# Patient Record
Sex: Female | Born: 1975
Health system: Southern US, Community
[De-identification: ages and names within clinical notes are randomized; demographics above are authoritative.]

## PROBLEM LIST (undated history)

## (undated) DIAGNOSIS — K649 Unspecified hemorrhoids: Secondary | ICD-10-CM

## (undated) DIAGNOSIS — R112 Nausea with vomiting, unspecified: Secondary | ICD-10-CM

## (undated) DIAGNOSIS — Z9889 Other specified postprocedural states: Secondary | ICD-10-CM

## (undated) HISTORY — PX: GANGLION CYST EXCISION: SHX1691

## (undated) HISTORY — PX: ROTATOR CUFF REPAIR: SHX139

---

## 1997-05-17 ENCOUNTER — Other Ambulatory Visit: Admission: RE | Admit: 1997-05-17 | Discharge: 1997-05-17 | Payer: Self-pay | Admitting: Obstetrics and Gynecology

## 1998-03-28 ENCOUNTER — Other Ambulatory Visit: Admission: RE | Admit: 1998-03-28 | Discharge: 1998-03-28 | Payer: Self-pay | Admitting: Obstetrics and Gynecology

## 1998-04-19 ENCOUNTER — Other Ambulatory Visit: Admission: RE | Admit: 1998-04-19 | Discharge: 1998-04-19 | Payer: Self-pay | Admitting: Obstetrics and Gynecology

## 1998-07-15 ENCOUNTER — Inpatient Hospital Stay (HOSPITAL_COMMUNITY): Admission: AD | Admit: 1998-07-15 | Discharge: 1998-07-15 | Payer: Self-pay | Admitting: *Deleted

## 1999-01-11 ENCOUNTER — Inpatient Hospital Stay (HOSPITAL_COMMUNITY): Admission: AD | Admit: 1999-01-11 | Discharge: 1999-01-11 | Payer: Self-pay | Admitting: Obstetrics and Gynecology

## 1999-02-15 ENCOUNTER — Inpatient Hospital Stay (HOSPITAL_COMMUNITY): Admission: AD | Admit: 1999-02-15 | Discharge: 1999-02-15 | Payer: Self-pay | Admitting: Obstetrics and Gynecology

## 1999-02-21 ENCOUNTER — Inpatient Hospital Stay (HOSPITAL_COMMUNITY): Admission: AD | Admit: 1999-02-21 | Discharge: 1999-02-23 | Payer: Self-pay | Admitting: Obstetrics and Gynecology

## 1999-03-21 ENCOUNTER — Other Ambulatory Visit: Admission: RE | Admit: 1999-03-21 | Discharge: 1999-03-21 | Payer: Self-pay | Admitting: Obstetrics and Gynecology

## 2000-02-18 ENCOUNTER — Other Ambulatory Visit: Admission: RE | Admit: 2000-02-18 | Discharge: 2000-02-18 | Payer: Self-pay | Admitting: *Deleted

## 2002-03-30 ENCOUNTER — Other Ambulatory Visit: Admission: RE | Admit: 2002-03-30 | Discharge: 2002-03-30 | Payer: Self-pay | Admitting: Obstetrics and Gynecology

## 2003-04-05 ENCOUNTER — Other Ambulatory Visit: Admission: RE | Admit: 2003-04-05 | Discharge: 2003-04-05 | Payer: Self-pay | Admitting: Obstetrics and Gynecology

## 2003-12-11 ENCOUNTER — Ambulatory Visit: Payer: Self-pay | Admitting: Unknown Physician Specialty

## 2004-01-03 ENCOUNTER — Ambulatory Visit: Payer: Self-pay | Admitting: Unknown Physician Specialty

## 2004-01-18 ENCOUNTER — Encounter: Payer: Self-pay | Admitting: Unknown Physician Specialty

## 2004-01-21 ENCOUNTER — Encounter: Payer: Self-pay | Admitting: Unknown Physician Specialty

## 2004-01-21 DIAGNOSIS — D229 Melanocytic nevi, unspecified: Secondary | ICD-10-CM

## 2004-01-21 HISTORY — DX: Melanocytic nevi, unspecified: D22.9

## 2004-02-21 ENCOUNTER — Encounter: Payer: Self-pay | Admitting: Unknown Physician Specialty

## 2004-04-11 ENCOUNTER — Other Ambulatory Visit: Admission: RE | Admit: 2004-04-11 | Discharge: 2004-04-11 | Payer: Self-pay | Admitting: Obstetrics and Gynecology

## 2004-04-25 ENCOUNTER — Encounter: Payer: Self-pay | Admitting: General Practice

## 2004-05-20 ENCOUNTER — Encounter: Payer: Self-pay | Admitting: General Practice

## 2004-06-20 ENCOUNTER — Encounter: Payer: Self-pay | Admitting: General Practice

## 2005-01-08 ENCOUNTER — Other Ambulatory Visit: Admission: RE | Admit: 2005-01-08 | Discharge: 2005-01-08 | Payer: Self-pay | Admitting: Obstetrics and Gynecology

## 2005-07-25 ENCOUNTER — Inpatient Hospital Stay (HOSPITAL_COMMUNITY): Admission: AD | Admit: 2005-07-25 | Discharge: 2005-07-26 | Payer: Self-pay | Admitting: Obstetrics and Gynecology

## 2008-01-21 HISTORY — PX: OTHER SURGICAL HISTORY: SHX169

## 2009-12-26 ENCOUNTER — Emergency Department (HOSPITAL_COMMUNITY)
Admission: EM | Admit: 2009-12-26 | Discharge: 2009-12-26 | Payer: Self-pay | Source: Home / Self Care | Admitting: Emergency Medicine

## 2010-04-02 LAB — URINALYSIS, ROUTINE W REFLEX MICROSCOPIC
Bilirubin Urine: NEGATIVE
Glucose, UA: NEGATIVE mg/dL
Hgb urine dipstick: NEGATIVE
Ketones, ur: NEGATIVE mg/dL
Protein, ur: NEGATIVE mg/dL
Specific Gravity, Urine: 1.01 (ref 1.005–1.030)
pH: 5.5 (ref 5.0–8.0)

## 2010-04-02 LAB — CBC
HCT: 39 % (ref 36.0–46.0)
MCH: 30.5 pg (ref 26.0–34.0)
MCV: 85 fL (ref 78.0–100.0)
Platelets: 186 10*3/uL (ref 150–400)
WBC: 10.8 10*3/uL — ABNORMAL HIGH (ref 4.0–10.5)

## 2010-04-02 LAB — URINE MICROSCOPIC-ADD ON

## 2010-04-02 LAB — DIFFERENTIAL
Basophils Absolute: 0 10*3/uL (ref 0.0–0.1)
Basophils Relative: 0 % (ref 0–1)
Eosinophils Absolute: 0.1 10*3/uL (ref 0.0–0.7)
Monocytes Absolute: 0.6 10*3/uL (ref 0.1–1.0)
Monocytes Relative: 6 % (ref 3–12)

## 2010-04-02 LAB — BASIC METABOLIC PANEL
BUN: 14 mg/dL (ref 6–23)
CO2: 26 mEq/L (ref 19–32)
Chloride: 108 mEq/L (ref 96–112)
GFR calc Af Amer: >60 mL/min (ref >60)
GFR calc non Af Amer: >60 mL/min (ref >60)
Glucose, Bld: 99 mg/dL (ref 70–99)
Potassium: 3.8 mEq/L (ref 3.5–5.1)
Sodium: 138 mEq/L (ref 135–145)

## 2010-04-02 LAB — POCT CARDIAC MARKERS

## 2010-04-02 LAB — POCT PREGNANCY, URINE: Preg Test, Ur: NEGATIVE

## 2010-06-07 NOTE — Op Note (Signed)
[facility]  Patient:    Deborah Mills, Deborah Mills                       MRN: [id number] Attending:  Beather Arbour. Thomasena Edis, M.D.                           Operative Report  PREOPERATIVE DIAGNOSIS:       Vertex at +3 station, deep variables with pushing.  POSTOPERATIVE DIAGNOSIS:      Same.  PROCEDURE:                    Vacuum extraction delivery.  SURGEON:                      Artist Pais, M.D.  ESTIMATED BLOOD LOSS:         300 cc.  ANESTHESIA:                   Epidural/local with lidocaine.  DESCRIPTION OF PROCEDURE:     Patient had been pushing well.  Fetal heart tracing was noted to have repetitive deep variables with pushing.  The decision was made due to the deep variables to pursue vacuum extraction delivery.  Vertex was noted to be on the OA position.  Adequate pelvis with good epidural anesthesia, although the epidural had been turned off for quite a while.  The mushroom cup vacuum extractor was placed in appropriate position and with two contractions the vertex was delivered without difficulty over a second degree median episiotomy.  There is noted to be one pop-off.  There is a compound presentation with the infants left hand.  The shoulders were delivered without difficulty.  The cord was doubly clamped and cut after placing the infant on the maternal abdomen.  Placenta was  allowed to delivery spontaneous and intact with a three-vessel cord.  Cord pH and cord bloods were sent.  Cord pH was found to be 7.36.  There were noted to be no lacerations.  Repair of the episiotomy was performed using 3-0 Dexon in the usual fashion.  Approximately 7 cc of 1% lidocaine were infused for episiotomy repair. After repair there was noted to be an intact rectovaginal septum.  No sponges in the vagina.  Patient tolerated procedure well without complications.  No apparent complications and went to the normal postpartum floor.  The baby was found  to be a viable female, Apgars 9&9, cord pH 7.36.  Addendum:                     Vacuum extraction delivery was performed after obtaining informed consent. DD:  02/22/99 TD:  02/22/99 Job: [id number]

## 2011-05-26 ENCOUNTER — Ambulatory Visit: Payer: Self-pay | Admitting: Urology

## 2011-07-30 ENCOUNTER — Ambulatory Visit: Payer: Self-pay | Admitting: Internal Medicine

## 2011-10-18 ENCOUNTER — Ambulatory Visit: Payer: Self-pay | Admitting: Unknown Physician Specialty

## 2012-02-17 ENCOUNTER — Ambulatory Visit: Payer: Self-pay | Admitting: Urology

## 2013-09-25 IMAGING — CR DG LUMBAR SPINE 2-3V
1 series · 4 of 4 positions shown · non-contrast
Comparison: none

REASON FOR EXAM: pain
COMMENTS:

PROCEDURE:     KDR - KDXR LUMBAR SPINE AP AND LATERAL  - February 17, 2012  [DATE]
RESULT:     Lumbar spine shows grossly normal alignment without compression
deformity. No subluxation is seen. There is no bony destruction. MRI is
available for further assessment if desired.

[Series 1: ap · 0.17mm/px · 4 of 4 slices shown]
[im 1/4]
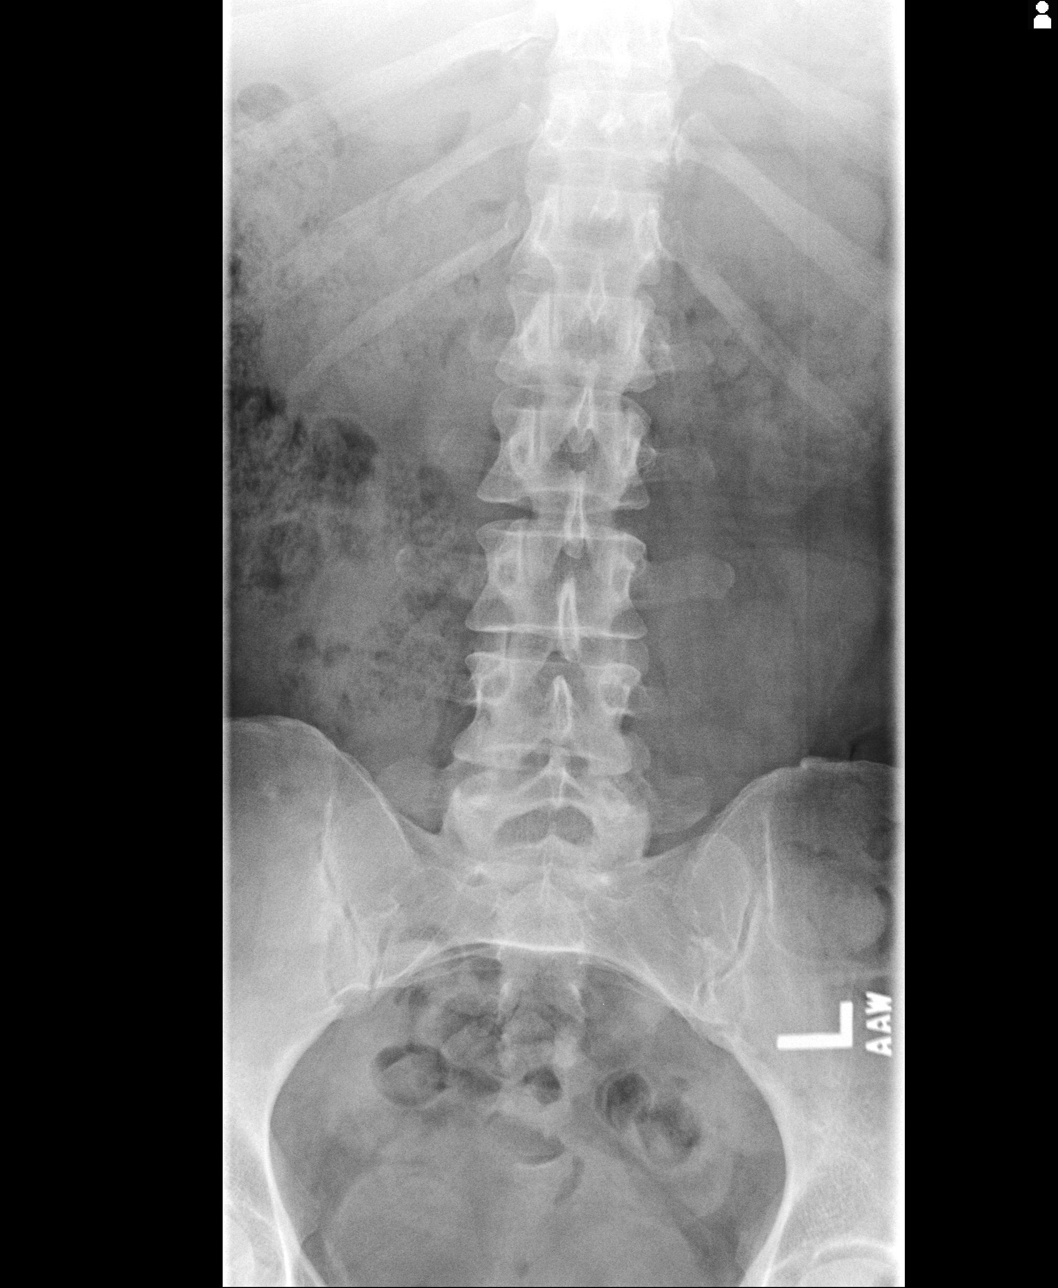
[im 2/4]
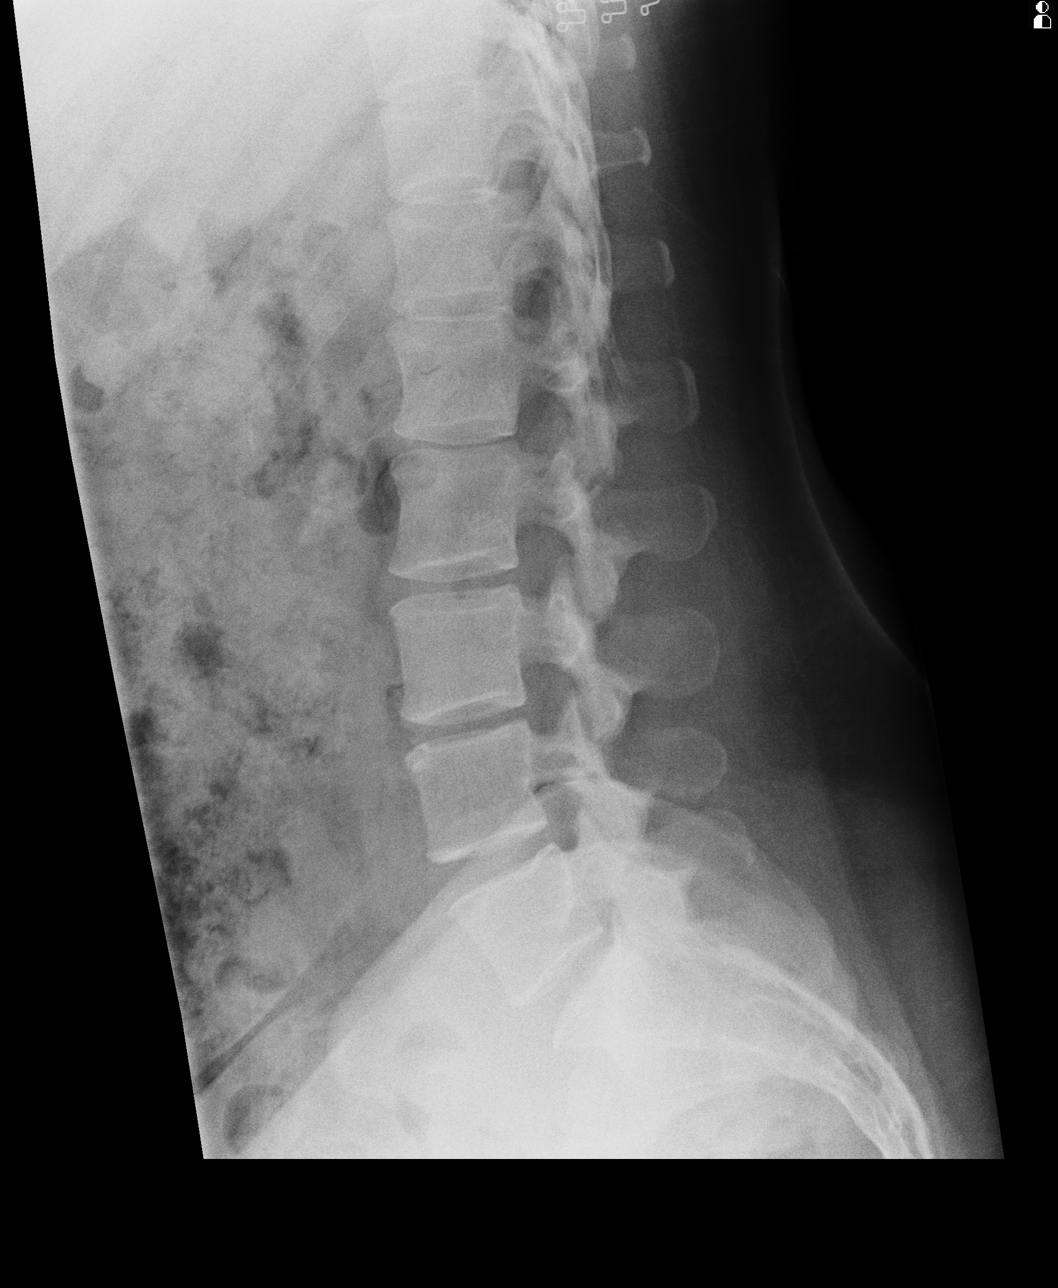
[im 3/4]
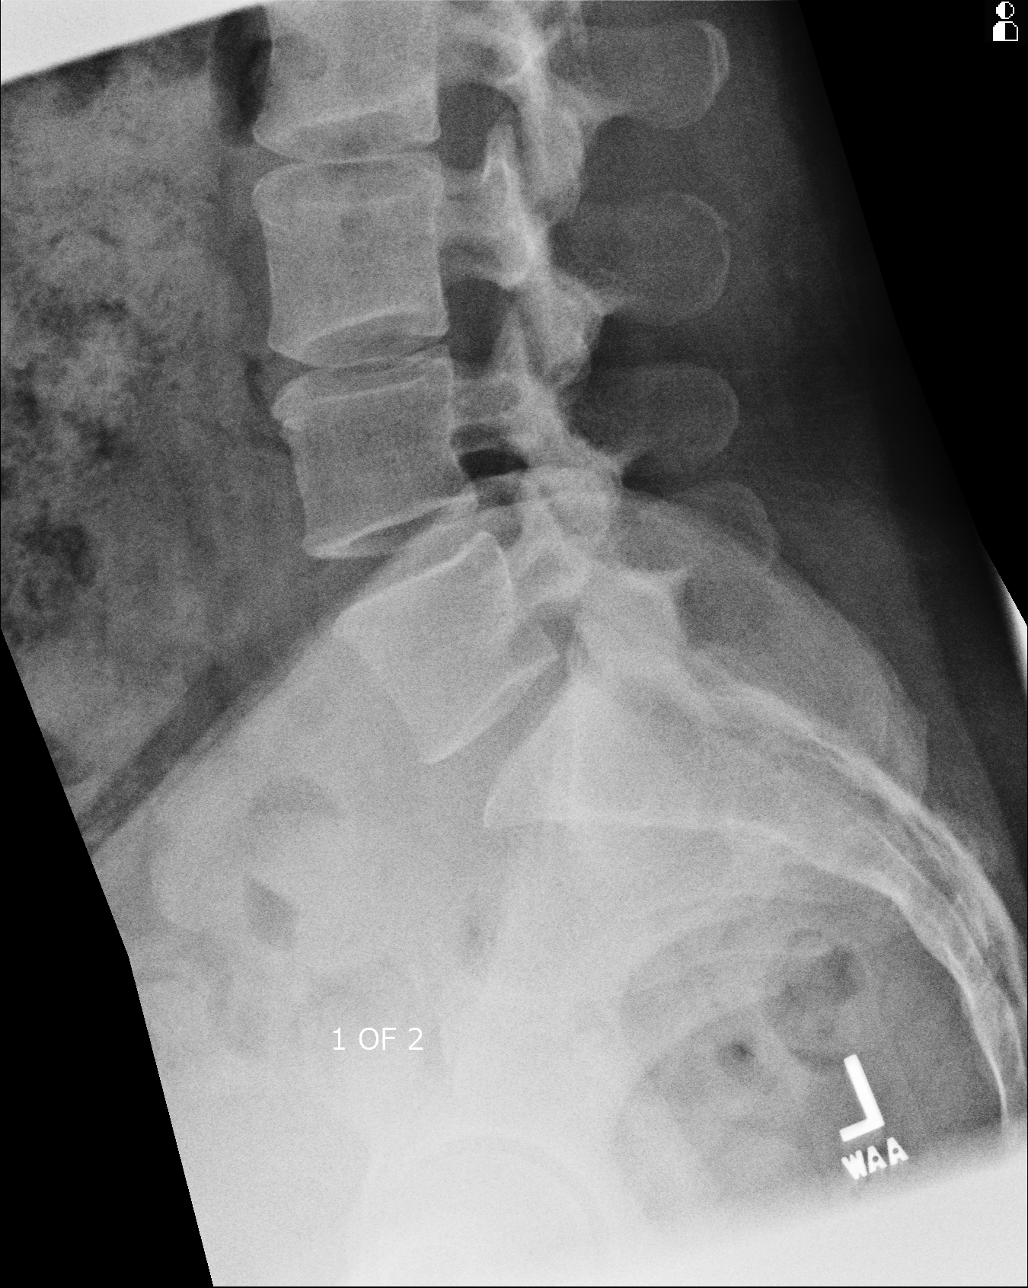
[im 4/4]
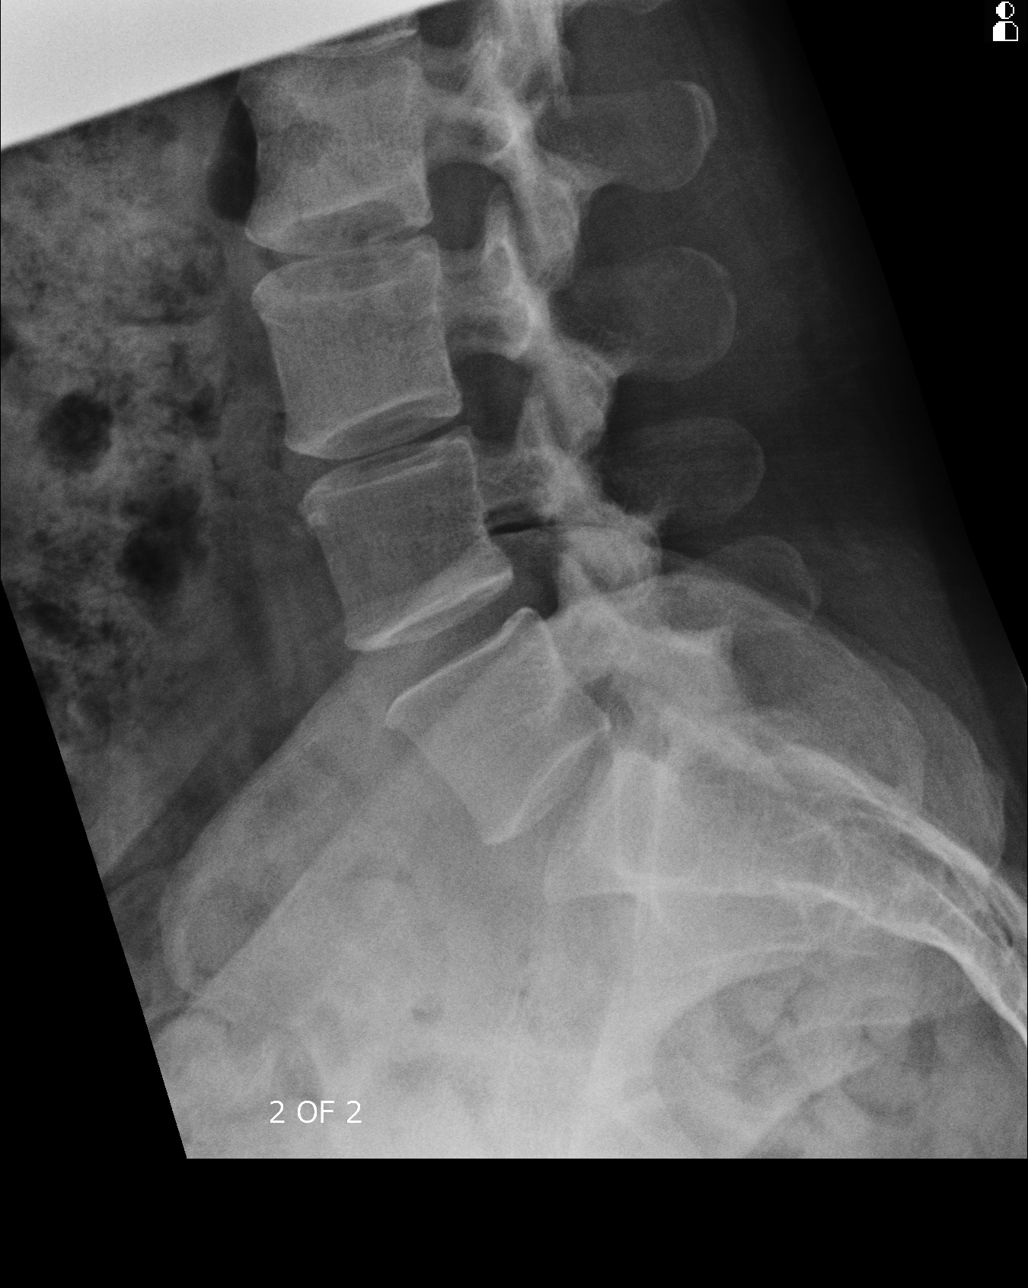

[4 of 4 positions shown; findings below may reference images not displayed]

IMPRESSION: Please see above.

[REDACTED]

## 2013-09-25 IMAGING — CR DG HIP COMPLETE 2+V*L*
1 series · 2 of 2 positions shown · non-contrast
Comparison: none

REASON FOR EXAM: low back pain, hip pain
COMMENTS:

PROCEDURE:     KDR - KDXR HIP LEFT COMPLETE  - February 17, 2012  [DATE]
RESULT:     AP and frog-leg lateral views of the left hip demonstrate no
fracture, dislocation or foreign body. No significant degenerative change is
appreciated. There is no focal bony lesion evident.

[Series 1: ap · 0.17mm/px · 2 of 2 slices shown]
[im 1/2]
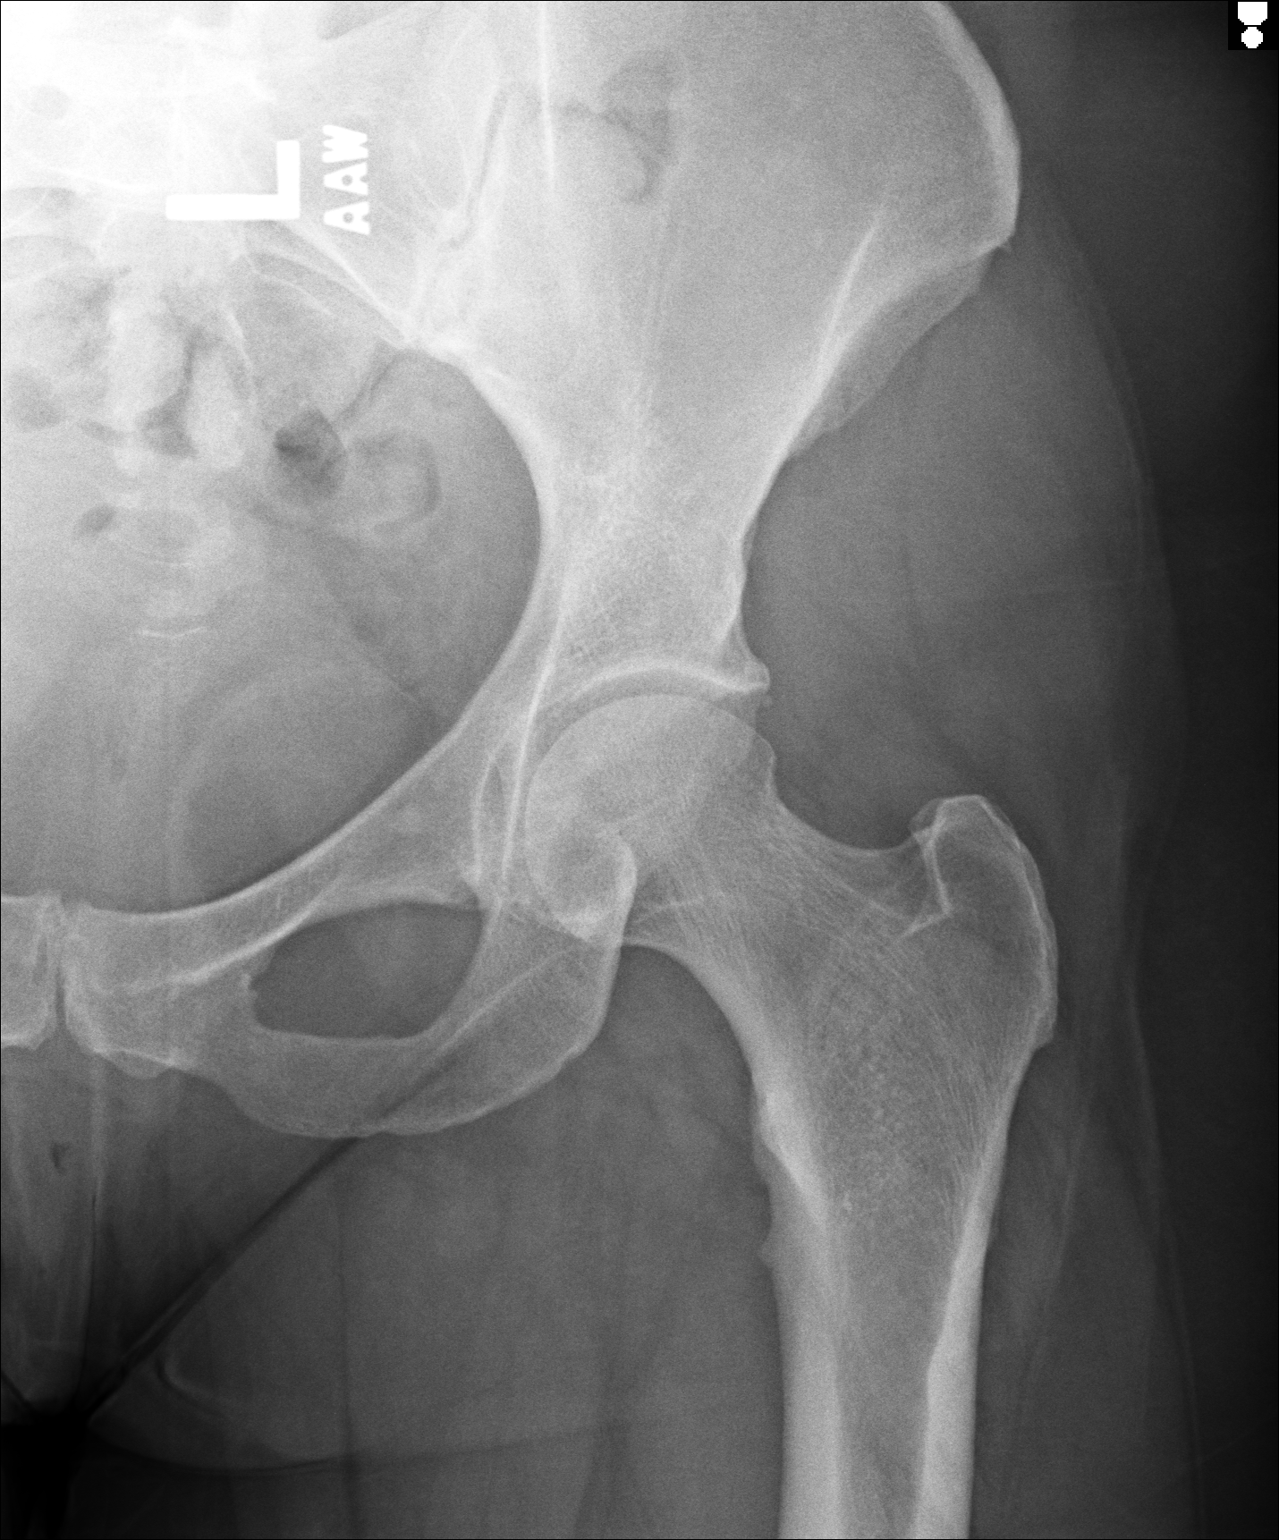
[im 2/2]
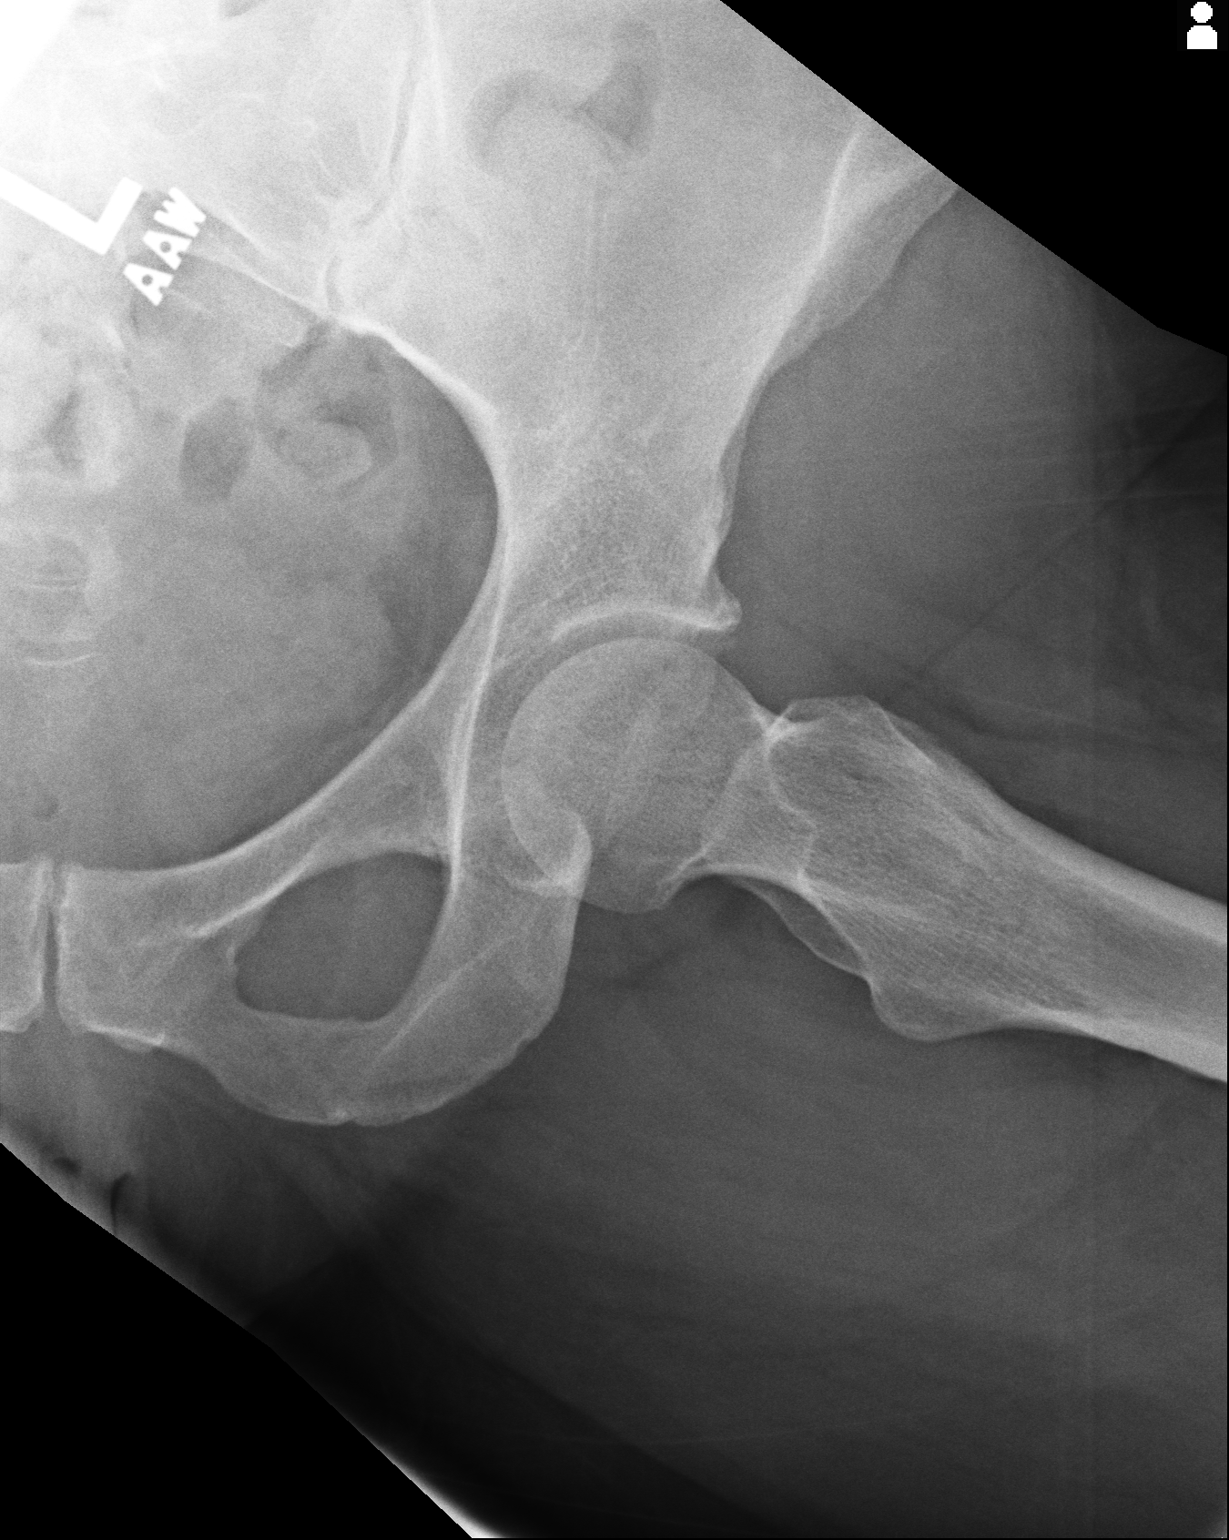

[2 of 2 positions shown; findings below may reference images not displayed]

IMPRESSION: Please see above.

[REDACTED]

## 2013-09-25 IMAGING — CR DG ABDOMEN 1V
1 series · 3 of 3 positions shown · non-contrast
Comparison: none

REASON FOR EXAM: low back pain, hip pain
COMMENTS:

PROCEDURE:     KDR - KDXR KIDNEY URETER BLADDER  - February 17, 2012  [DATE]
RESULT:     There is a moderate amount of fecal material scattered through
the colon. No definite nephrolithiasis is appreciated. No ureteral calculi
are evident.

[Series 1: supine kub · 0.17mm/px · 3 of 3 slices shown]
[im 1/3]
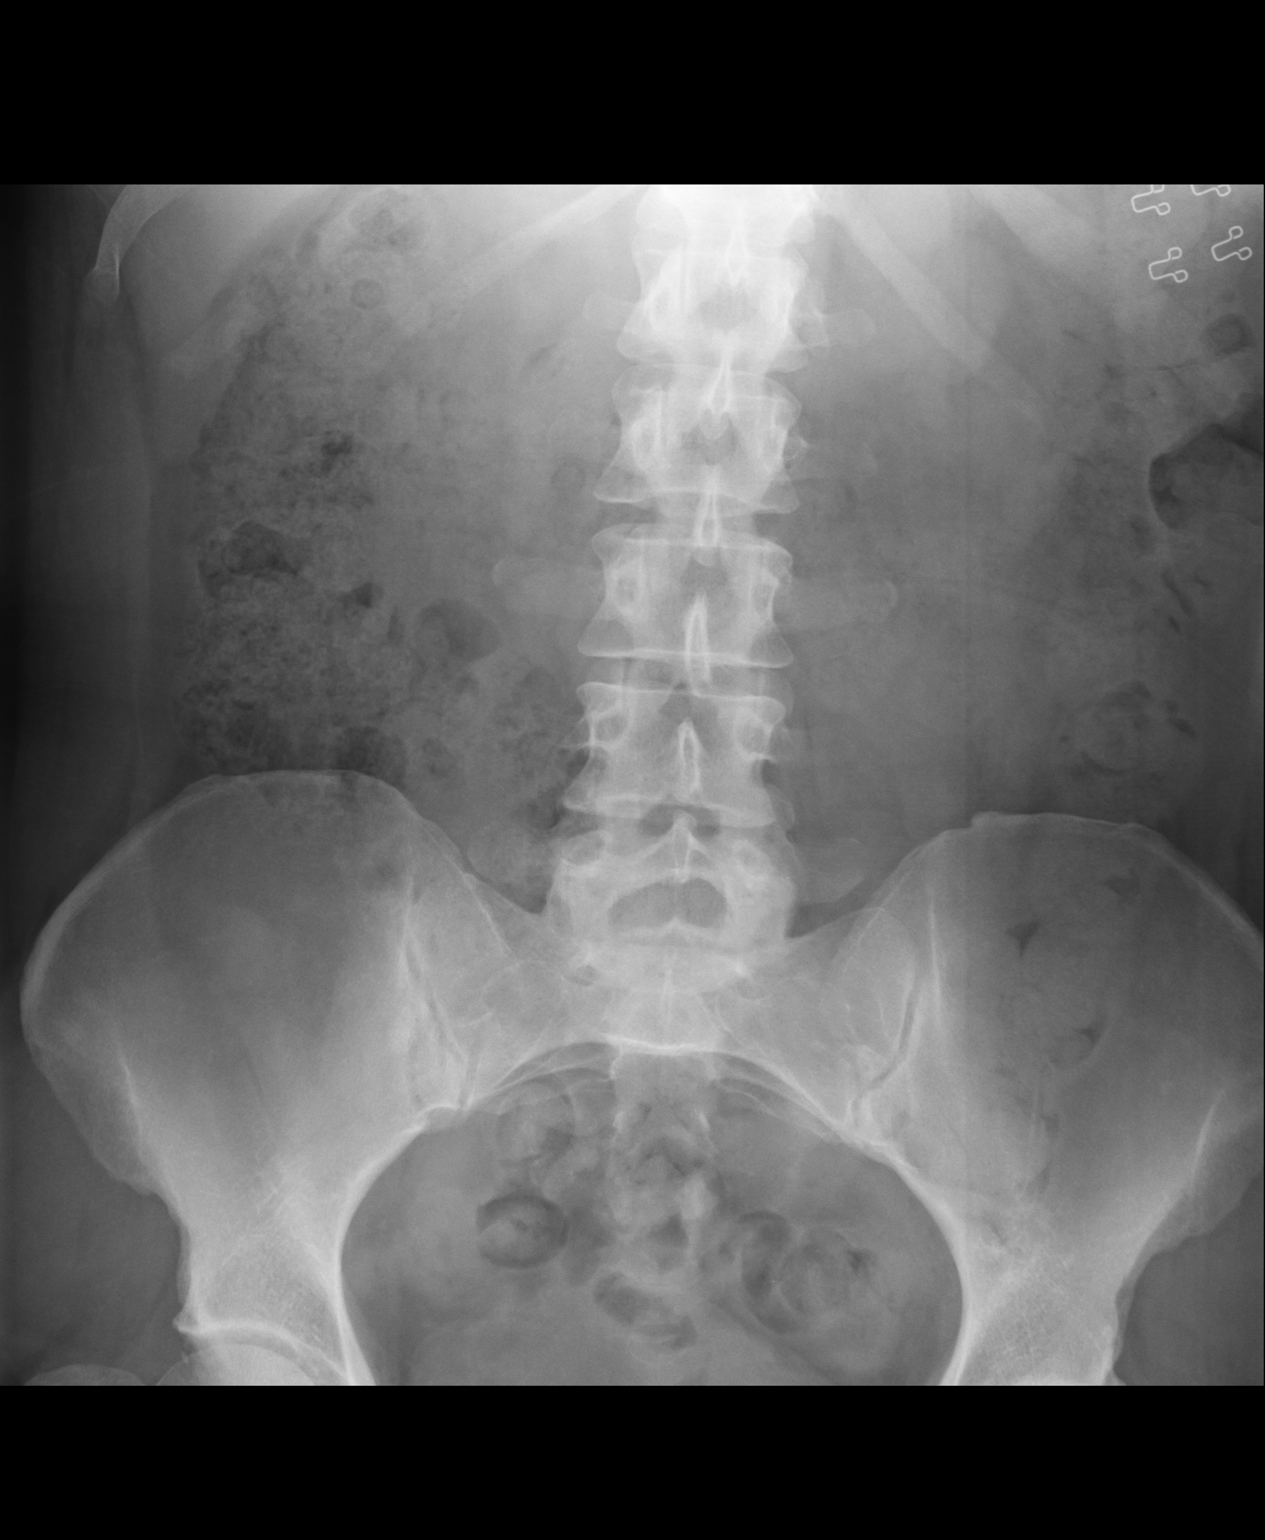
[im 2/3]
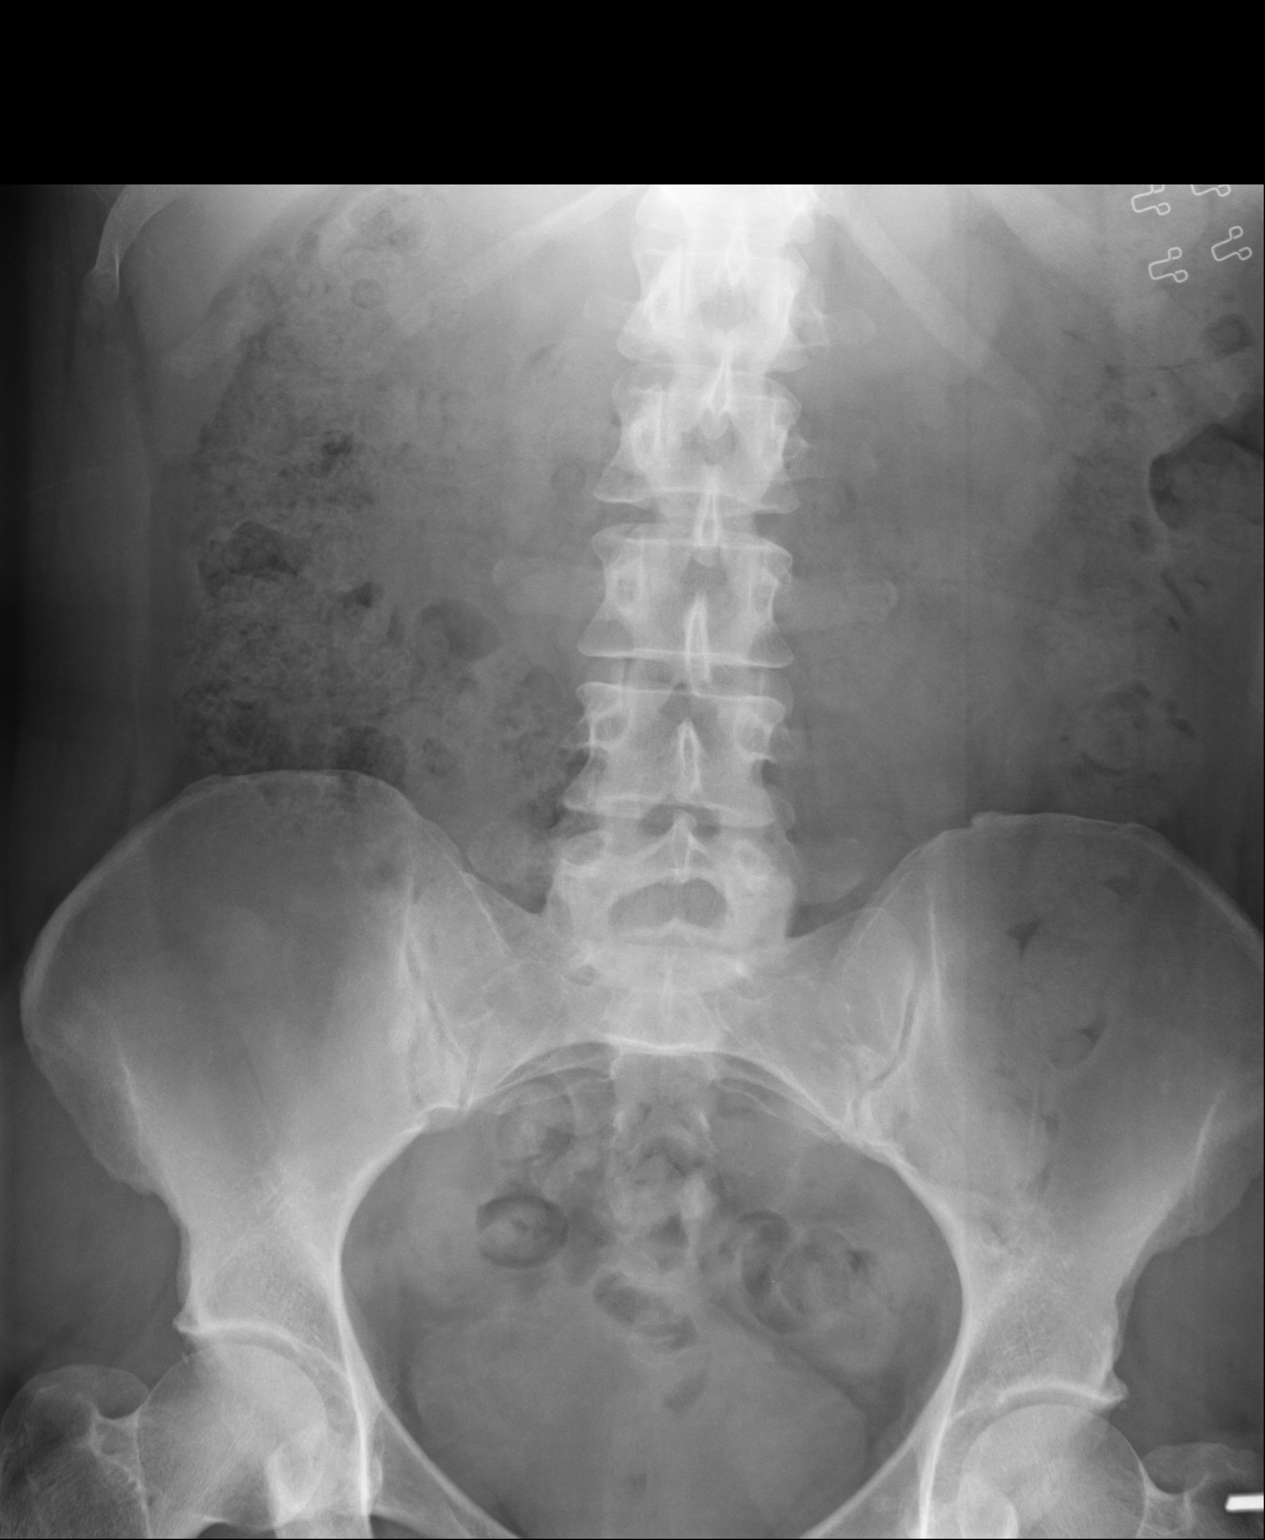
[im 3/3]
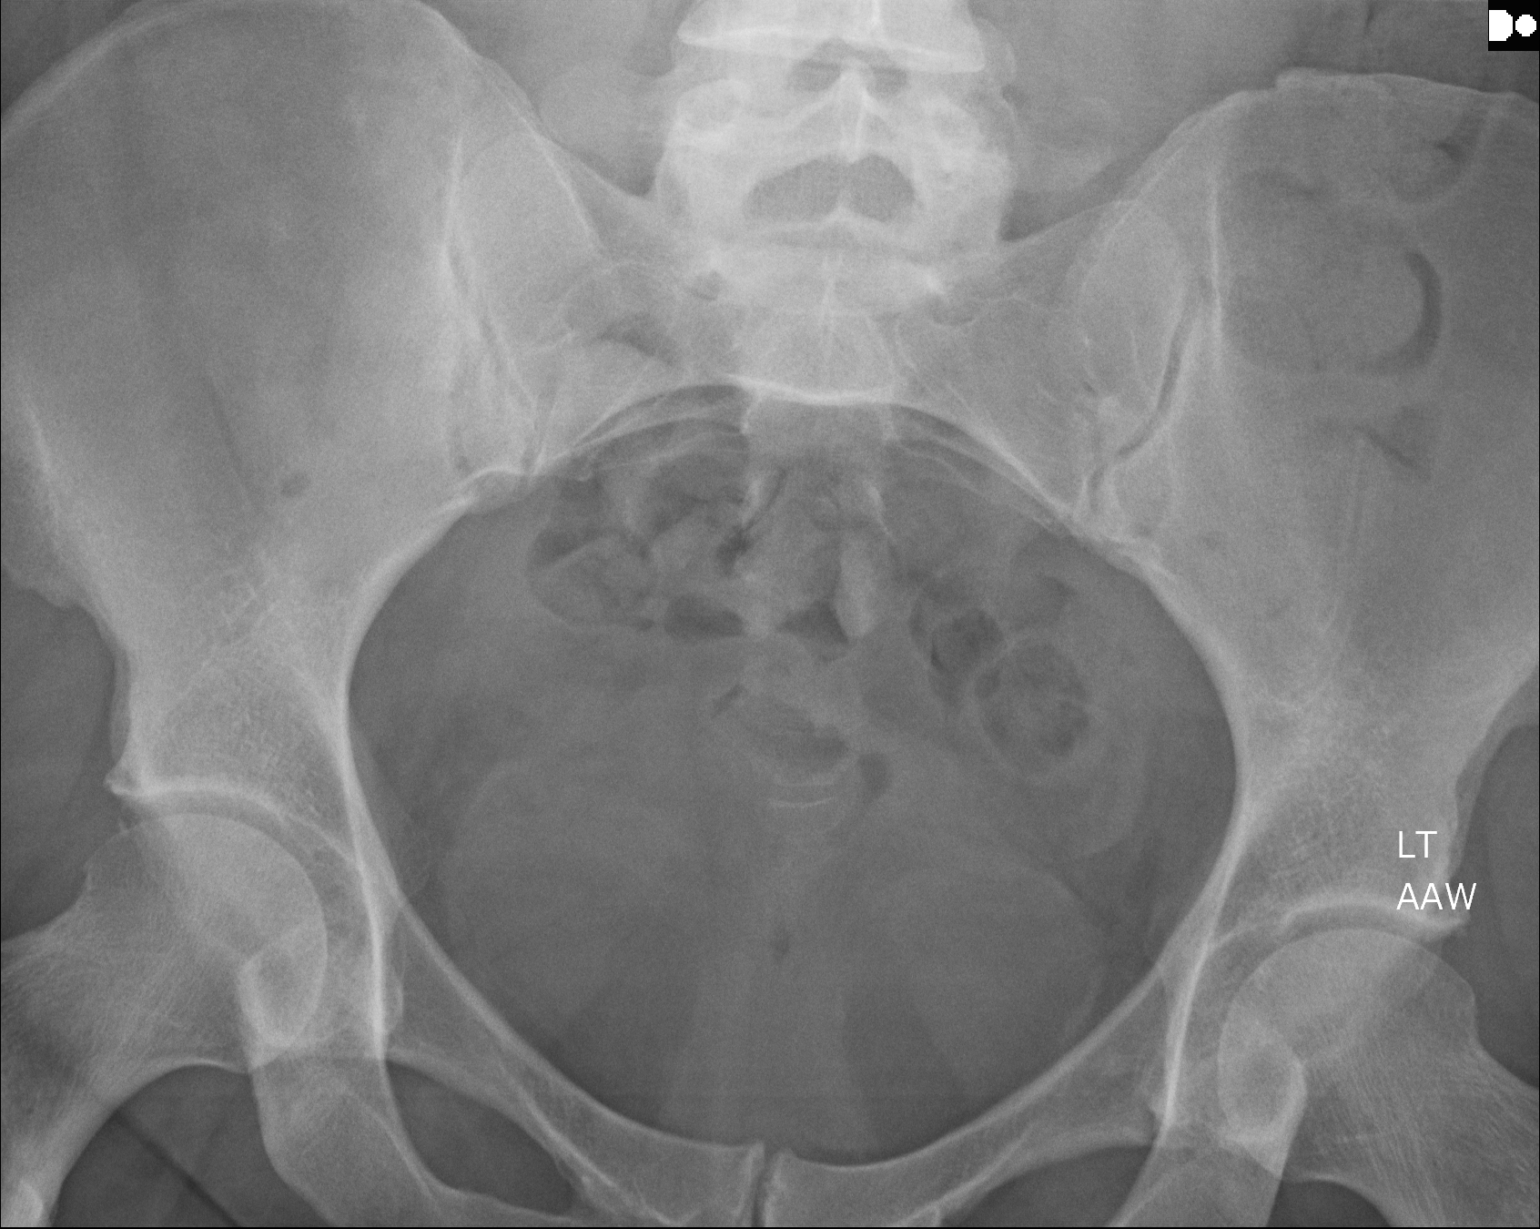

[3 of 3 positions shown; findings below may reference images not displayed]

IMPRESSION: Moderate amount of fecal material present. No stones are
identified.

[REDACTED]

## 2014-10-27 ENCOUNTER — Other Ambulatory Visit: Payer: Self-pay | Admitting: Internal Medicine

## 2014-10-27 ENCOUNTER — Encounter: Payer: Self-pay | Admitting: Internal Medicine

## 2014-10-27 DIAGNOSIS — M5030 Other cervical disc degeneration, unspecified cervical region: Secondary | ICD-10-CM | POA: Insufficient documentation

## 2014-10-27 DIAGNOSIS — F064 Anxiety disorder due to known physiological condition: Secondary | ICD-10-CM | POA: Insufficient documentation

## 2014-10-27 DIAGNOSIS — G43909 Migraine, unspecified, not intractable, without status migrainosus: Secondary | ICD-10-CM | POA: Insufficient documentation

## 2014-10-27 DIAGNOSIS — R002 Palpitations: Secondary | ICD-10-CM | POA: Insufficient documentation

## 2014-10-27 DIAGNOSIS — R609 Edema, unspecified: Secondary | ICD-10-CM | POA: Insufficient documentation

## 2014-11-03 ENCOUNTER — Ambulatory Visit (INDEPENDENT_AMBULATORY_CARE_PROVIDER_SITE_OTHER): Payer: 59 | Admitting: Internal Medicine

## 2014-11-03 ENCOUNTER — Other Ambulatory Visit: Payer: Self-pay | Admitting: Internal Medicine

## 2014-11-03 ENCOUNTER — Encounter: Payer: Self-pay | Admitting: Internal Medicine

## 2014-11-03 VITALS — BP 100/60 | HR 76 | Ht 64.5 in | Wt 189.4 lb

## 2014-11-03 DIAGNOSIS — Z6830 Body mass index (BMI) 30.0-30.9, adult: Secondary | ICD-10-CM

## 2014-11-03 DIAGNOSIS — G43C0 Periodic headache syndromes in child or adult, not intractable: Secondary | ICD-10-CM | POA: Diagnosis not present

## 2014-11-03 DIAGNOSIS — F064 Anxiety disorder due to known physiological condition: Secondary | ICD-10-CM

## 2014-11-03 DIAGNOSIS — F418 Other specified anxiety disorders: Secondary | ICD-10-CM | POA: Diagnosis not present

## 2014-11-03 DIAGNOSIS — Z23 Encounter for immunization: Secondary | ICD-10-CM | POA: Diagnosis not present

## 2014-11-03 NOTE — Progress Notes (Signed)
Date:  11/03/2014   Name:  Deborah Mills   DOB:  14-Aug-1975   MRN:  [id number]   Chief Complaint: Depression  Patient reports doing very well on Paxil for chronic anxiety and depression. She been working to exercise and try to maintain her weight with some weight loss as well. Working for [employer] she needs to meet biometric in order to get a reduction in her premium. Her current BMI is 30.1. She takes Paxil does make it harder for her to lose weight and she needs a note to that effect.  Review of Systems  Constitutional: Negative for fever, diaphoresis, fatigue and unexpected weight change.  Respiratory: Negative for chest tightness and shortness of breath.   Cardiovascular: Negative for chest pain, palpitations and leg swelling.  Neurological: Positive for headaches (intermittent; respond to otc medication).  Psychiatric/Behavioral: Negative for suicidal ideas, sleep disturbance, self-injury and dysphoric mood.    Patient Active Problem List   Diagnosis Date Noted  . Anxiety disorder due to known physiological condition 10/27/2014  . Awareness of heartbeats 10/27/2014  . Degeneration of intervertebral disc of cervical region 10/27/2014  . Accumulation of fluid in tissues 10/27/2014  . Headache, migraine 10/27/2014    Prior to Admission medications   Medication Sig Start Date End Date Taking? Authorizing Provider  PARoxetine (PAXIL) 40 MG tablet Take 1 tablet by mouth daily. 02/02/12  Yes Historical Provider, MD    No Known Allergies  Past Surgical History  Procedure Laterality Date  . Rotator cuff repair    . Uterine ablation  2010  . Ganglion cyst excision      Social History  Substance Use Topics  . Smoking status: Never Smoker   . Smokeless tobacco: None  . Alcohol Use: No    Medication list has been reviewed and updated.   Physical Exam  Constitutional: She is oriented to person, place, and time. She appears well-developed and well-nourished.  Neck: Normal range  of motion. Carotid bruit is not present.  Cardiovascular: Normal rate, regular rhythm and normal heart sounds.   Pulmonary/Chest: Effort normal and breath sounds normal.  Abdominal: Normal appearance.  Neurological: She is alert and oriented to person, place, and time. She has normal reflexes.  Psychiatric: She has a normal mood and affect.  Nursing note and vitals reviewed.   BP 100/60 mmHg  Pulse 76  Ht 5' 4.5" (1.638 m)  Wt 189 lb 6.4 oz (85.911 kg)  BMI 32.02 kg/m2  Assessment and Plan: 1. Anxiety disorder due to known physiological condition Stable;Continue Paxil  2. Periodic headache syndrome, not intractable Intermittent headaches responding to over-the-counter medication   3. BMI 30.0-30.9,adult Note is given to excuse her from meeting the BMI metric   4. Need for diphtheria-tetanus-pertussis (Tdap) vaccine - Tdap vaccine greater than or equal to 7yo IM   Halina Maidens, MD [facility]  11/03/2014

## 2014-11-03 NOTE — Patient Instructions (Addendum)
Tdap Vaccine (Tetanus, Diphtheria and Pertussis): What You Need to Know 1. Why get vaccinated? Tetanus, diphtheria and pertussis are very serious diseases. Tdap vaccine can protect us from these diseases. And, Tdap vaccine given to pregnant women can protect newborn babies against pertussis. TETANUS (Lockjaw) is rare in the [place] today. It causes painful muscle tightening and stiffness, usually all over the body.  It can lead to tightening of muscles in the head and neck so you can't open your mouth, swallow, or sometimes even breathe. Tetanus kills about 1 out of 10 people who are infected even after receiving the best medical care. DIPHTHERIA is also rare in the [place] today. It can cause a thick coating to form in the back of the throat.  It can lead to breathing problems, heart failure, paralysis, and death. PERTUSSIS (Whooping Cough) causes severe coughing spells, which can cause difficulty breathing, vomiting and disturbed sleep.  It can also lead to weight loss, incontinence, and rib fractures. Up to 2 in 100 adolescents and 5 in 100 adults with pertussis are hospitalized or have complications, which could include pneumonia or death. These diseases are caused by bacteria. Diphtheria and pertussis are spread from person to person through secretions from coughing or sneezing. Tetanus enters the body through cuts, scratches, or wounds. Before vaccines, as many as 200,000 cases of diphtheria, 200,000 cases of pertussis, and hundreds of cases of tetanus, were reported in the [place] each year. Since vaccination began, reports of cases for tetanus and diphtheria have dropped by about 99% and for pertussis by about 80%. 2. Tdap vaccine Tdap vaccine can protect adolescents and adults from tetanus, diphtheria, and pertussis. One dose of Tdap is routinely given at age 11 or 12. People who did not get Tdap at that age should get it as soon as possible. Tdap is especially important  for healthcare professionals and anyone having close contact with a baby younger than 12 months. Pregnant women should get a dose of Tdap during every pregnancy, to protect the newborn from pertussis. Infants are most at risk for severe, life-threatening complications from pertussis. Another vaccine, called Td, protects against tetanus and diphtheria, but not pertussis. A Td booster should be given every 10 years. Tdap may be given as one of these boosters if you have never gotten Tdap before. Tdap may also be given after a severe cut or burn to prevent tetanus infection. Your doctor or the person giving you the vaccine can give you more information. Tdap may safely be given at the same time as other vaccines. 3. Some people should not get this vaccine  A person who has ever had a life-threatening allergic reaction after a previous dose of any diphtheria, tetanus or pertussis containing vaccine, OR has a severe allergy to any part of this vaccine, should not get Tdap vaccine. Tell the person giving the vaccine about any severe allergies.  Anyone who had coma or long repeated seizures within 7 days after a childhood dose of DTP or DTaP, or a previous dose of Tdap, should not get Tdap, unless a cause other than the vaccine was found. They can still get Td.  Talk to your doctor if you:  have seizures or another nervous system problem,  had severe pain or swelling after any vaccine containing diphtheria, tetanus or pertussis,  ever had a condition called Guillain-Barr Syndrome (GBS),  aren't feeling well on the day the shot is scheduled. 4. Risks With any medicine, including vaccines, there is   a chance of side effects. These are usually mild and go away on their own. Serious reactions are also possible but are rare. Most people who get Tdap vaccine do not have any problems with it. Mild problems following Tdap (Did not interfere with activities)  Pain where the shot was given (about 3 in 4  adolescents or 2 in 3 adults)  Redness or swelling where the shot was given (about 1 person in 5)  Mild fever of at least 100.4F (up to about 1 in 25 adolescents or 1 in 100 adults)  Headache (about 3 or 4 people in 10)  Tiredness (about 1 person in 3 or 4)  Nausea, vomiting, diarrhea, stomach ache (up to 1 in 4 adolescents or 1 in 10 adults)  Chills, sore joints (about 1 person in 10)  Body aches (about 1 person in 3 or 4)  Rash, swollen glands (uncommon) Moderate problems following Tdap (Interfered with activities, but did not require medical attention)  Pain where the shot was given (up to 1 in 5 or 6)  Redness or swelling where the shot was given (up to about 1 in 16 adolescents or 1 in 12 adults)  Fever over 102F (about 1 in 100 adolescents or 1 in 250 adults)  Headache (about 1 in 7 adolescents or 1 in 10 adults)  Nausea, vomiting, diarrhea, stomach ache (up to 1 or 3 people in 100)  Swelling of the entire arm where the shot was given (up to about 1 in 500). Severe problems following Tdap (Unable to perform usual activities; required medical attention)  Swelling, severe pain, bleeding and redness in the arm where the shot was given (rare). Problems that could happen after any vaccine:  People sometimes faint after a medical procedure, including vaccination. Sitting or lying down for about 15 minutes can help prevent fainting, and injuries caused by a fall. Tell your doctor if you feel dizzy, or have vision changes or ringing in the ears.  Some people get severe pain in the shoulder and have difficulty moving the arm where a shot was given. This happens very rarely.  Any medication can cause a severe allergic reaction. Such reactions from a vaccine are very rare, estimated at fewer than 1 in a million doses, and would happen within a few minutes to a few hours after the vaccination. As with any medicine, there is a very remote chance of a vaccine causing a serious  injury or death. The safety of vaccines is always being monitored. For more information, visit: www.cdc.gov/vaccinesafety/ 5. What if there is a serious problem? What should I look for?  Look for anything that concerns you, such as signs of a severe allergic reaction, very high fever, or unusual behavior.  Signs of a severe allergic reaction can include hives, swelling of the face and throat, difficulty breathing, a fast heartbeat, dizziness, and weakness. These would usually start a few minutes to a few hours after the vaccination. What should I do?  If you think it is a severe allergic reaction or other emergency that can't wait, call 9-1-1 or get the person to the nearest hospital. Otherwise, call your doctor.  Afterward, the reaction should be reported to the Vaccine Adverse Event Reporting System (VAERS). Your doctor might file this report, or you can do it yourself through the VAERS web site at www.vaers.hhs.gov, or by calling 1-800-822-7967. VAERS does not give medical advice.  6. The National Vaccine Injury Compensation Program The National Vaccine Injury Compensation Program (  VICP) is a federal program that was created to compensate people who may have been injured by certain vaccines. Persons who believe they may have been injured by a vaccine can learn about the program and about filing a claim by calling (319) 025-1878 or visiting the [place] website at GoldCloset.com.ee. There is a time limit to file a claim for compensation. 7. How can I learn more?  Ask your doctor. He or she can give you the vaccine package insert or suggest other sources of information.  Call your local or state health department.  Contact the Centers for Disease Control and Prevention (CDC):  Call 316-437-1100 (1-800-CDC-INFO) or  Visit CDC's website at http://hunter.com/ CDC Tdap Vaccine VIS (03/15/13)   This information is not intended to replace advice given to you by your health care  provider. Make sure you discuss any questions you have with your health care provider.   Document Released: 07/08/2011 Document Revised: 01/27/2014 Document Reviewed: 04/20/2013 Elsevier Interactive Patient Education 2016 [place] Released: 07/08/2011 Document Revised: 01/27/2014 Document Reviewed: 04/20/2013 Elsevier Interactive Patient Education [employer].

## 2015-05-01 ENCOUNTER — Ambulatory Visit: Payer: Self-pay | Admitting: Internal Medicine

## 2015-07-26 ENCOUNTER — Encounter: Payer: Self-pay | Admitting: Internal Medicine

## 2015-07-26 ENCOUNTER — Ambulatory Visit (INDEPENDENT_AMBULATORY_CARE_PROVIDER_SITE_OTHER): Payer: 59 | Admitting: Internal Medicine

## 2015-07-26 VITALS — BP 123/78 | HR 78 | Resp 16 | Ht 64.5 in | Wt 177.6 lb

## 2015-07-26 DIAGNOSIS — F064 Anxiety disorder due to known physiological condition: Secondary | ICD-10-CM

## 2015-07-26 DIAGNOSIS — F418 Other specified anxiety disorders: Secondary | ICD-10-CM

## 2015-07-26 MED ORDER — ALPRAZOLAM 0.25 MG PO TABS: 0.2500 mg | ORAL_TABLET | Freq: Two times a day (BID) | ORAL | Status: DC | PRN

## 2015-07-26 NOTE — Progress Notes (Signed)
    Date:  07/26/2015   Name:  Deborah Mills   DOB:  01-06-1976   MRN:  [id number]   Chief Complaint: Anxiety Anxiety Presents for follow-up visit. The problem has been waxing and waning. Symptoms include insomnia, irritability and nervous/anxious behavior. Patient reports no chest pain, palpitations or shortness of breath. Symptoms occur most days. The symptoms are aggravated by family issues.   Past treatments include SSRIs. Compliance with prior treatments has been good.  Under some increased stress - mother in law in Hospice and father in law with advanced dementia now in ALF.  Her GYN gives Paxil but she would like to have some xanax.   Review of Systems  Constitutional: Positive for irritability. Negative for fever, chills and fatigue.  Respiratory: Negative for cough, chest tightness and shortness of breath.   Cardiovascular: Negative for chest pain, palpitations and leg swelling.  Gastrointestinal: Negative for abdominal pain.  Psychiatric/Behavioral: Positive for sleep disturbance. Negative for behavioral problems, dysphoric mood and agitation. The patient is nervous/anxious and has insomnia.     Patient Active Problem List   Diagnosis Date Noted  . Anxiety disorder due to known physiological condition 10/27/2014  . Awareness of heartbeats 10/27/2014  . Degeneration of intervertebral disc of cervical region 10/27/2014  . Accumulation of fluid in tissues 10/27/2014  . Headache, migraine 10/27/2014    Prior to Admission medications   Medication Sig Start Date End Date Taking? Authorizing Provider  PARoxetine (PAXIL) 40 MG tablet Take 1 tablet by mouth daily. 02/02/12  Yes Historical Provider, MD    No Known Allergies  Past Surgical History  Procedure Laterality Date  . Rotator cuff repair    . Uterine ablation  2010  . Ganglion cyst excision      Social History  Substance Use Topics  . Smoking status: Never Smoker   . Smokeless tobacco: None  . Alcohol Use: No      Medication list has been reviewed and updated.   Physical Exam  Constitutional: She is oriented to person, place, and time. She appears well-developed. No distress.  HENT:  Head: Normocephalic and atraumatic.  Neck: Normal range of motion. Neck supple.  Cardiovascular: Normal rate, regular rhythm and normal heart sounds.   Pulmonary/Chest: Effort normal and breath sounds normal. No respiratory distress.  Musculoskeletal: Normal range of motion.  Lymphadenopathy:    She has no cervical adenopathy.  Neurological: She is alert and oriented to person, place, and time.  Skin: Skin is warm and dry. No rash noted.  Psychiatric: She has a normal mood and affect. Her behavior is normal. Thought content normal.  Nursing note and vitals reviewed.   BP 123/78 mmHg  Pulse 78  Resp 16  Ht 5' 4.5" (1.638 m)  Wt 177 lb 9.6 oz (80.559 kg)  BMI 30.03 kg/m2  SpO2 98%  LMP 07/20/2008  Assessment and Plan: 1. Anxiety disorder due to known physiological condition Continue Paxil prescribed by GYN - ALPRAZolam (XANAX) 0.25 MG tablet; Take 1 tablet (0.25 mg total) by mouth 2 (two) times daily as needed for anxiety.  Dispense: 60 tablet; Refill: [place], MD [facility]  07/26/2015

## 2015-08-29 ENCOUNTER — Encounter: Payer: Self-pay | Admitting: Internal Medicine

## 2015-08-29 ENCOUNTER — Ambulatory Visit (INDEPENDENT_AMBULATORY_CARE_PROVIDER_SITE_OTHER): Payer: 59 | Admitting: Internal Medicine

## 2015-08-29 VITALS — BP 122/82 | HR 71 | Resp 16 | Ht 64.5 in | Wt 179.2 lb

## 2015-08-29 DIAGNOSIS — Z0000 Encounter for general adult medical examination without abnormal findings: Secondary | ICD-10-CM

## 2015-08-29 DIAGNOSIS — K5901 Slow transit constipation: Secondary | ICD-10-CM | POA: Diagnosis not present

## 2015-08-29 DIAGNOSIS — G43009 Migraine without aura, not intractable, without status migrainosus: Secondary | ICD-10-CM

## 2015-08-29 DIAGNOSIS — E785 Hyperlipidemia, unspecified: Secondary | ICD-10-CM | POA: Insufficient documentation

## 2015-08-29 DIAGNOSIS — F064 Anxiety disorder due to known physiological condition: Secondary | ICD-10-CM | POA: Diagnosis not present

## 2015-08-29 LAB — POCT URINALYSIS DIPSTICK
BILIRUBIN UA: NEGATIVE
Blood, UA: NEGATIVE
Glucose, UA: NEGATIVE
KETONES UA: NEGATIVE
LEUKOCYTES UA: NEGATIVE
Nitrite, UA: NEGATIVE
PROTEIN UA: NEGATIVE
Spec Grav, UA: 1.015
pH, UA: 6

## 2015-08-29 MED ORDER — LUBIPROSTONE 24 MCG PO CAPS: 24.0000 ug | ORAL_CAPSULE | Freq: Two times a day (BID) | ORAL | 0 refills | Status: DC

## 2015-08-29 NOTE — Progress Notes (Signed)
Date:  08/29/2015   Name:  Deborah Mills   DOB:  07-11-1975   MRN:  [id number]   Chief Complaint: Annual Exam (PAP done at [facility]. Patient will fax [place] form from [place] for [place] to fill out on this CPE. Order any labs they require. ) Deborah Mills is a 40 y.o. female who presents today for her Complete Annual Exam. She feels well. She reports exercising regularly. She reports she is sleeping well.   Migraine   This is a recurrent problem. The current episode started more than 1 year ago. The problem occurs intermittently. The problem has been unchanged. Pertinent negatives include no abdominal pain, coughing, dizziness, fever, hearing loss, tinnitus or vomiting. She has tried acetaminophen for the symptoms. The treatment provided moderate relief.  Anxiety  Presents for follow-up visit. Symptoms include nervous/anxious behavior and palpitations. Patient reports no chest pain, dizziness or shortness of breath. Symptoms occur occasionally. The severity of symptoms is mild. The quality of sleep is good.   Compliance with medications is 76-100%.  Constipation  This is a chronic problem. The current episode started more than 1 year ago. The problem is unchanged. Her stool frequency is 1 time per week or less. The stool is described as firm. The patient is on a high fiber diet. She exercises regularly. There has been adequate water intake. Pertinent negatives include no abdominal pain, diarrhea, fever or vomiting. She has tried fiber, enemas, laxatives and stool softeners (all treatments have not been very successful - often has to use an enema to evacuate) for the symptoms. The treatment provided mild relief.     Review of Systems  Constitutional: Negative for chills, fatigue and fever.  HENT: Negative for congestion, hearing loss, tinnitus, trouble swallowing and voice change.   Eyes: Negative for visual disturbance.  Respiratory: Negative for cough, chest tightness, shortness of breath and  wheezing.   Cardiovascular: Positive for palpitations. Negative for chest pain and leg swelling.  Gastrointestinal: Positive for constipation. Negative for abdominal pain, diarrhea and vomiting.  Endocrine: Negative for polydipsia and polyuria.  Genitourinary: Negative for dysuria, frequency, genital sores, vaginal bleeding and vaginal discharge.  Musculoskeletal: Negative for arthralgias, gait problem and joint swelling.  Skin: Negative for color change and rash.  Neurological: Positive for headaches. Negative for dizziness, tremors and light-headedness.  Hematological: Negative for adenopathy. Does not bruise/bleed easily.  Psychiatric/Behavioral: Negative for dysphoric mood and sleep disturbance. The patient is nervous/anxious.     Patient Active Problem List   Diagnosis Date Noted  . Hyperlipidemia, mild 08/29/2015  . Anxiety disorder due to known physiological condition 10/27/2014  . Awareness of heartbeats 10/27/2014  . Degeneration of intervertebral disc of cervical region 10/27/2014  . Accumulation of fluid in tissues 10/27/2014  . Headache, migraine 10/27/2014    Prior to Admission medications   Medication Sig Start Date End Date Taking? Authorizing Provider  ALPRAZolam (XANAX) 0.25 MG tablet Take 1 tablet (0.25 mg total) by mouth 2 (two) times daily as needed for anxiety. 07/26/15  Yes Glean Hess, MD  PARoxetine (PAXIL) 40 MG tablet Take 1 tablet by mouth daily. 02/02/12  Yes Historical Provider, MD    No Known Allergies  Past Surgical History:  Procedure Laterality Date  . GANGLION CYST EXCISION    . ROTATOR CUFF REPAIR    . Uterine ablation  2010    Social History  Substance Use Topics  . Smoking status: Never Smoker  . Smokeless tobacco: Never Used  .  Alcohol use No     Medication list has been reviewed and updated.   Physical Exam  Constitutional: She is oriented to person, place, and time. She appears well-developed and well-nourished. No distress.    HENT:  Head: Normocephalic and atraumatic.  Right Ear: Tympanic membrane and ear canal normal.  Left Ear: Tympanic membrane and ear canal normal.  Nose: Right sinus exhibits no maxillary sinus tenderness. Left sinus exhibits no maxillary sinus tenderness.  Mouth/Throat: Uvula is midline and oropharynx is clear and moist.  Eyes: Conjunctivae and EOM are normal. Right eye exhibits no discharge. Left eye exhibits no discharge. No scleral icterus.  Neck: Normal range of motion. Carotid bruit is not present. No erythema present. No thyromegaly present.  Cardiovascular: Normal rate, regular rhythm, normal heart sounds and normal pulses.   Pulmonary/Chest: Effort normal. No respiratory distress. She has no wheezes.  Abdominal: Soft. Bowel sounds are normal. She exhibits no mass. There is no hepatosplenomegaly. There is no tenderness. There is no rebound, no guarding and no CVA tenderness.  Musculoskeletal: Normal range of motion.  Lymphadenopathy:    She has no cervical adenopathy.    She has no axillary adenopathy.  Neurological: She is alert and oriented to person, place, and time. She has normal reflexes. No cranial nerve deficit or sensory deficit.  Skin: Skin is warm, dry and intact. No rash noted.  Psychiatric: She has a normal mood and affect. Her speech is normal and behavior is normal. Thought content normal.  Nursing note and vitals reviewed.   BP 122/82 (BP Location: Right Arm, Patient Position: Sitting, Cuff Size: Normal)   Pulse 71   Resp 16   Ht 5' 4.5" (1.638 m)   Wt 179 lb 3.2 oz (81.3 kg)   SpO2 98%   BMI 30.28 kg/m   Assessment and Plan: 1. Annual physical exam Continue healthy diet and exercise Pap and pelvic, mammogram done by GYN Form for BMI exception will be forwards from patient for completion - POCT urinalysis dipstick  2. Nonintractable migraine, unspecified migraine type Unchanged; responds to tylenol and sleep  3. Anxiety disorder due to known  physiological condition Doing well on Paxil and PRN xanax  4. Hyperlipidemia, mild Labs will be done by [facility]  5. Slow transit constipation Needs more aggressive therapy - samples of Amitiza given - TSH - lubiprostone (AMITIZA) 24 MCG capsule; Take 1 capsule (24 mcg total) by mouth 2 (two) times daily with a meal.  Dispense: 16 capsule; Refill: 0   Halina Maidens, MD [facility]  08/29/2015

## 2015-08-29 NOTE — Patient Instructions (Signed)
Breast Self-Awareness Practicing breast self-awareness may pick up problems early, prevent significant medical complications, and possibly save your life. By practicing breast self-awareness, you can become familiar with how your breasts look and feel and if your breasts are changing. This allows you to notice changes early. It can also offer you some reassurance that your breast health is good. One way to learn what is normal for your breasts and whether your breasts are changing is to do a breast self-exam. If you find a lump or something that was not present in the past, it is best to contact your caregiver right away. Other findings that should be evaluated by your caregiver include nipple discharge, especially if it is bloody; skin changes or reddening; areas where the skin seems to be pulled in (retracted); or new lumps and bumps. Breast pain is seldom associated with cancer (malignancy), but should also be evaluated by a caregiver. HOW TO PERFORM A BREAST SELF-EXAM The best time to examine your breasts is 5-7 days after your menstrual period is over. During menstruation, the breasts are lumpier, and it may be more difficult to pick up changes. If you do not menstruate, have reached menopause, or had your uterus removed (hysterectomy), you should examine your breasts at regular intervals, such as monthly. If you are breastfeeding, examine your breasts after a feeding or after using a breast pump. Breast implants do not decrease the risk for lumps or tumors, so continue to perform breast self-exams as recommended. Talk to your caregiver about how to determine the difference between the implant and breast tissue. Also, talk about the amount of pressure you should use during the exam. Over time, you will become more familiar with the variations of your breasts and more comfortable with the exam. A breast self-exam requires you to remove all your clothes above the waist. 1. Look at your breasts and nipples.  Stand in front of a mirror in a room with good lighting. With your hands on your hips, push your hands firmly downward. Look for a difference in shape, contour, and size from one breast to the other (asymmetry). Asymmetry includes puckers, dips, or bumps. Also, look for skin changes, such as reddened or scaly areas on the breasts. Look for nipple changes, such as discharge, dimpling, repositioning, or redness. 2. Carefully feel your breasts. This is best done either in the shower or tub while using soapy water or when flat on your back. Place the arm (on the side of the breast you are examining) above your head. Use the pads (not the fingertips) of your three middle fingers on your opposite hand to feel your breasts. Start in the underarm area and use  inch (2 cm) overlapping circles to feel your breast. Use 3 different levels of pressure (light, medium, and firm pressure) at each circle before moving to the next circle. The light pressure is needed to feel the tissue closest to the skin. The medium pressure will help to feel breast tissue a little deeper, while the firm pressure is needed to feel the tissue close to the ribs. Continue the overlapping circles, moving downward over the breast until you feel your ribs below your breast. Then, move one finger-width towards the center of the body. Continue to use the  inch (2 cm) overlapping circles to feel your breast as you move slowly up toward the collar bone (clavicle) near the base of the neck. Continue the up and down exam using all 3 pressures until you reach the   middle of the chest. Do this with each breast, carefully feeling for lumps or changes. 3.  Keep a written record with breast changes or normal findings for each breast. By writing this information down, you do not need to depend only on memory for size, tenderness, or location. Write down where you are in your menstrual cycle, if you are still menstruating. Breast tissue can have some lumps or  thick tissue. However, see your caregiver if you find anything that concerns you.  SEEK MEDICAL CARE IF:  You see a change in shape, contour, or size of your breasts or nipples.   You see skin changes, such as reddened or scaly areas on the breasts or nipples.   You have an unusual discharge from your nipples.   You feel a new lump or unusually thick areas.    This information is not intended to replace advice given to you by your health care provider. Make sure you discuss any questions you have with your health care provider.   Document Released: 01/06/2005 Document Revised: 12/24/2011 Document Reviewed: 04/23/2011 Elsevier Interactive Patient Education 2016 [employer]  

## 2015-08-30 LAB — TSH: TSH: 1.16 u[IU]/mL (ref 0.450–4.500)

## 2015-09-19 ENCOUNTER — Telehealth: Payer: Self-pay

## 2015-09-19 ENCOUNTER — Other Ambulatory Visit: Payer: Self-pay | Admitting: Internal Medicine

## 2015-09-19 DIAGNOSIS — K5901 Slow transit constipation: Secondary | ICD-10-CM

## 2015-09-19 MED ORDER — LUBIPROSTONE 24 MCG PO CAPS: 24.0000 ug | ORAL_CAPSULE | Freq: Two times a day (BID) | ORAL | 3 refills | Status: DC

## 2015-09-19 NOTE — Telephone Encounter (Signed)
Patient said samples worked well please send 90 day Rx of Amitiza to [facility]. [facility]

## 2015-10-01 ENCOUNTER — Telehealth: Payer: Self-pay

## 2015-10-01 ENCOUNTER — Other Ambulatory Visit: Payer: Self-pay | Admitting: Internal Medicine

## 2015-10-01 MED ORDER — METHYLPREDNISOLONE 4 MG PO TBPK: ORAL_TABLET | ORAL | 0 refills | Status: DC

## 2015-10-01 NOTE — Telephone Encounter (Signed)
Pt wants pred taper sent in for poison ivey

## 2015-10-04 ENCOUNTER — Encounter: Payer: Self-pay | Admitting: Internal Medicine

## 2015-10-04 ENCOUNTER — Ambulatory Visit (INDEPENDENT_AMBULATORY_CARE_PROVIDER_SITE_OTHER): Payer: 59 | Admitting: Internal Medicine

## 2015-10-04 VITALS — BP 118/78 | HR 74 | Temp 97.9°F | Resp 16 | Ht 64.5 in | Wt 173.0 lb

## 2015-10-04 DIAGNOSIS — L03211 Cellulitis of face: Secondary | ICD-10-CM | POA: Diagnosis not present

## 2015-10-04 MED ORDER — MUPIROCIN CALCIUM 2 % EX CREA: 1.0000 "application " | TOPICAL_CREAM | Freq: Two times a day (BID) | CUTANEOUS | 0 refills | Status: DC

## 2015-10-04 MED ORDER — SULFAMETHOXAZOLE-TRIMETHOPRIM 800-160 MG PO TABS: 1.0000 | ORAL_TABLET | Freq: Two times a day (BID) | ORAL | 0 refills | Status: DC

## 2015-10-04 NOTE — Progress Notes (Signed)
    Date:  10/04/2015   Name:  Deborah Mills   DOB:  01-Mar-1975   MRN:  [id number]   Chief Complaint: Rash (face and stopped prednisone after 1 dose )    Review of Systems  Patient Active Problem List   Diagnosis Date Noted  . Hyperlipidemia, mild 08/29/2015  . Anxiety disorder due to known physiological condition 10/27/2014  . Awareness of heartbeats 10/27/2014  . Degeneration of intervertebral disc of cervical region 10/27/2014  . Accumulation of fluid in tissues 10/27/2014  . Headache, migraine 10/27/2014    Prior to Admission medications   Medication Sig Start Date End Date Taking? Authorizing Provider  ALPRAZolam (XANAX) 0.25 MG tablet Take 1 tablet (0.25 mg total) by mouth 2 (two) times daily as needed for anxiety. 07/26/15  Yes Glean Hess, MD  lubiprostone (AMITIZA) 24 MCG capsule Take 1 capsule (24 mcg total) by mouth 2 (two) times daily with a meal. 09/19/15  Yes Glean Hess, MD  PARoxetine (PAXIL) 40 MG tablet Take 1 tablet by mouth daily. 02/02/12  Yes Historical Provider, MD  mupirocin cream (BACTROBAN) 2 % Apply 1 application topically 2 (two) times daily. 10/04/15   Glean Hess, MD  sulfamethoxazole-trimethoprim (BACTRIM DS,SEPTRA DS) 800-160 MG tablet Take 1 tablet by mouth 2 (two) times daily. 10/04/15   Glean Hess, MD    No Known Allergies  Past Surgical History:  Procedure Laterality Date  . GANGLION CYST EXCISION    . ROTATOR CUFF REPAIR    . Uterine ablation  2010    Social History  Substance Use Topics  . Smoking status: Never Smoker  . Smokeless tobacco: Never Used  . Alcohol use No     Medication list has been reviewed and updated.   Physical Exam  BP 118/78   Pulse 74   Temp 97.9 F (36.6 C)   Resp 16   Ht 5' 4.5" (1.638 m)   Wt 173 lb (78.5 kg)   SpO2 97%   BMI 29.24 kg/m   Assessment and Plan:

## 2015-10-04 NOTE — Progress Notes (Signed)
Date:  10/04/2015   Name:  Deborah Mills   DOB:  1975-10-19   MRN:  [id number]   Chief Complaint: Rash (face and stopped prednisone after 1 dose ) Rash  This is a new problem. The current episode started 1 to 4 weeks ago. The problem has been gradually worsening since onset. The affected locations include the face. The rash is characterized by blistering, burning and redness. She was exposed to plant contact. Pertinent negatives include no cough, fatigue, fever or shortness of breath.      Review of Systems  Constitutional: Positive for unexpected weight change. Negative for chills, fatigue and fever.  Respiratory: Negative for cough, chest tightness and shortness of breath.   Cardiovascular: Negative for chest pain, palpitations and leg swelling.  Skin: Positive for rash.  Neurological: Positive for tremors and headaches (became lightheaded in exam room - improved with reclining.  BP and pulse stable.).  Hematological: Negative for adenopathy. Does not bruise/bleed easily.  Psychiatric/Behavioral: Positive for dysphoric mood. The patient is nervous/anxious.     Patient Active Problem List   Diagnosis Date Noted  . Hyperlipidemia, mild 08/29/2015  . Anxiety disorder due to known physiological condition 10/27/2014  . Awareness of heartbeats 10/27/2014  . Degeneration of intervertebral disc of cervical region 10/27/2014  . Accumulation of fluid in tissues 10/27/2014  . Headache, migraine 10/27/2014    Prior to Admission medications   Medication Sig Start Date End Date Taking? Authorizing Provider  ALPRAZolam (XANAX) 0.25 MG tablet Take 1 tablet (0.25 mg total) by mouth 2 (two) times daily as needed for anxiety. 07/26/15  Yes Glean Hess, MD  lubiprostone (AMITIZA) 24 MCG capsule Take 1 capsule (24 mcg total) by mouth 2 (two) times daily with a meal. 09/19/15  Yes Glean Hess, MD  PARoxetine (PAXIL) 40 MG tablet Take 1 tablet by mouth daily. 02/02/12  Yes Historical  Provider, MD    No Known Allergies  Past Surgical History:  Procedure Laterality Date  . GANGLION CYST EXCISION    . ROTATOR CUFF REPAIR    . Uterine ablation  2010    Social History  Substance Use Topics  . Smoking status: Never Smoker  . Smokeless tobacco: Never Used  . Alcohol use No     Medication list has been reviewed and updated.   Physical Exam  Constitutional: She is oriented to person, place, and time. She appears well-developed. No distress.  HENT:  Head: Normocephalic and atraumatic.  Cardiovascular: Normal rate, regular rhythm and normal heart sounds.   Pulmonary/Chest: Effort normal and breath sounds normal. No respiratory distress.  Musculoskeletal: Normal range of motion.  Neurological: She is alert and oriented to person, place, and time.  Skin: Skin is warm and dry. No rash noted.  Red, crusty macules over chin, left cheek and neck.  Psychiatric: Her speech is normal and behavior is normal. Thought content normal. Her mood appears anxious.  Nursing note and vitals reviewed.   BP 118/78   Pulse 74   Temp 97.9 F (36.6 C)   Resp 16   Ht 5' 4.5" (1.638 m)   Wt 173 lb (78.5 kg)   SpO2 97%   BMI 29.24 kg/m   Assessment and Plan: 1. Cellulitis of face - sulfamethoxazole-trimethoprim (BACTRIM DS,SEPTRA DS) 800-160 MG tablet; Take 1 tablet by mouth 2 (two) times daily.  Dispense: 20 tablet; Refill: 0 - mupirocin cream (BACTROBAN) 2 %; Apply 1 application topically 2 (two) times daily.  Dispense:  15 g; Refill: 0   Halina Maidens, MD [facility]  10/04/2015

## 2015-10-16 ENCOUNTER — Other Ambulatory Visit: Payer: Self-pay | Admitting: Internal Medicine

## 2015-10-16 ENCOUNTER — Telehealth: Payer: Self-pay

## 2015-10-16 DIAGNOSIS — L03211 Cellulitis of face: Secondary | ICD-10-CM

## 2015-10-16 MED ORDER — SULFAMETHOXAZOLE-TRIMETHOPRIM 800-160 MG PO TABS: 1.0000 | ORAL_TABLET | Freq: Two times a day (BID) | ORAL | 0 refills | Status: DC

## 2015-10-16 NOTE — Telephone Encounter (Signed)
Patient wants Bactrim refill for spots on face, [place]

## 2015-10-16 NOTE — Telephone Encounter (Signed)
I sent the Rx to [facility] in [place] accidentally.

## 2016-08-29 ENCOUNTER — Ambulatory Visit (INDEPENDENT_AMBULATORY_CARE_PROVIDER_SITE_OTHER): Payer: 59 | Admitting: Internal Medicine

## 2016-08-29 VITALS — BP 122/68 | HR 70 | Ht 65.0 in | Wt 166.0 lb

## 2016-08-29 DIAGNOSIS — F064 Anxiety disorder due to known physiological condition: Secondary | ICD-10-CM | POA: Diagnosis not present

## 2016-08-29 DIAGNOSIS — R5383 Other fatigue: Secondary | ICD-10-CM | POA: Diagnosis not present

## 2016-08-29 DIAGNOSIS — Z0000 Encounter for general adult medical examination without abnormal findings: Secondary | ICD-10-CM

## 2016-08-29 DIAGNOSIS — L249 Irritant contact dermatitis, unspecified cause: Secondary | ICD-10-CM

## 2016-08-29 DIAGNOSIS — E785 Hyperlipidemia, unspecified: Secondary | ICD-10-CM | POA: Diagnosis not present

## 2016-08-29 LAB — POCT URINALYSIS DIPSTICK
Bilirubin, UA: NEGATIVE
Blood, UA: NEGATIVE
GLUCOSE UA: NEGATIVE
KETONES UA: NEGATIVE
LEUKOCYTES UA: NEGATIVE
Nitrite, UA: NEGATIVE
PROTEIN UA: NEGATIVE
SPEC GRAV UA: 1.01 (ref 1.010–1.025)
UROBILINOGEN UA: 0.2 U/dL
pH, UA: 6.5 (ref 5.0–8.0)

## 2016-08-29 NOTE — Patient Instructions (Signed)

## 2016-08-29 NOTE — Progress Notes (Signed)
Date:  08/29/2016   Name:  Deborah Mills   DOB:  [birth year]   MRN:  [id number]   Chief Complaint: Annual Exam Deborah Mills is a 41 y.o. female who presents today for her Complete Annual Exam. She feels well. She reports exercising some. She reports she is sleeping well. She sees GYN for pelvic and mammogram.  No breast complaints.  No menses s/p ablation.  She has happily met her BMI goal for work this year.  She had labs for biometrics earlier today - lipid, glucose, creatnine.  Anxiety  Presents for follow-up visit. Patient reports no chest pain, dizziness, nervous/anxious behavior, palpitations or shortness of breath. Symptoms occur rarely.    Hyperlipidemia  Associated symptoms include myalgias. Pertinent negatives include no chest pain or shortness of breath.   Myalgia - has general sense of muscle soreness which responds to advil.  Does not change with exercise or rest.  No joint pains.  Some fatigue that is long standing and unchanged.   Review of Systems  Constitutional: Positive for fatigue. Negative for chills, fever and unexpected weight change.  HENT: Negative for congestion, hearing loss, tinnitus, trouble swallowing and voice change.   Eyes: Negative for visual disturbance.  Respiratory: Negative for cough, chest tightness, shortness of breath and wheezing.   Cardiovascular: Negative for chest pain, palpitations and leg swelling.  Gastrointestinal: Positive for constipation. Negative for abdominal pain, diarrhea and vomiting.  Endocrine: Negative for polydipsia and polyuria.  Genitourinary: Negative for dysuria, frequency, genital sores, menstrual problem, vaginal bleeding and vaginal discharge.  Musculoskeletal: Positive for myalgias. Negative for arthralgias, gait problem and joint swelling.  Skin: Positive for rash. Negative for color change.  Neurological: Negative for dizziness, tremors, light-headedness and headaches.  Hematological: Negative for  adenopathy. Does not bruise/bleed easily.  Psychiatric/Behavioral: Negative for dysphoric mood and sleep disturbance. The patient is not nervous/anxious.     Patient Active Problem List   Diagnosis Date Noted  . Hyperlipidemia, mild 08/29/2015  . Anxiety disorder due to known physiological condition 10/27/2014  . Awareness of heartbeats 10/27/2014  . Degeneration of intervertebral disc of cervical region 10/27/2014  . Accumulation of fluid in tissues 10/27/2014  . Headache, migraine 10/27/2014    Prior to Admission medications   Medication Sig Start Date End Date Taking? Authorizing Provider  escitalopram (LEXAPRO) 20 MG tablet Take 20 mg by mouth daily.   Yes [provider]  ALPRAZolam (XANAX) 0.25 MG tablet Take 1 tablet (0.25 mg total) by mouth 2 (two) times daily as needed for anxiety. Patient not taking: Reported on 08/29/2016 07/26/15   Glean Hess, MD    No Known Allergies  Past Surgical History:  Procedure Laterality Date  . GANGLION CYST EXCISION    . ROTATOR CUFF REPAIR    . Uterine ablation  2010    Social History  Substance Use Topics  . Smoking status: Never Smoker  . Smokeless tobacco: Never Used  . Alcohol use No     Medication list has been reviewed and updated.   Physical Exam  Constitutional: She is oriented to person, place, and time. She appears well-developed and well-nourished. No distress.  HENT:  Head: Normocephalic and atraumatic.  Right Ear: Tympanic membrane and ear canal normal.  Left Ear: Tympanic membrane and ear canal normal.  Nose: Right sinus exhibits no maxillary sinus tenderness. Left sinus exhibits no maxillary sinus tenderness.  Mouth/Throat: Uvula is midline and oropharynx is clear and moist.  Eyes: Conjunctivae and  EOM are normal. Right eye exhibits no discharge. Left eye exhibits no discharge. No scleral icterus.  Neck: Normal range of motion. Carotid bruit is not present. No erythema present. No thyromegaly  present.  Cardiovascular: Normal rate, regular rhythm, normal heart sounds and normal pulses.   Pulmonary/Chest: Effort normal. No respiratory distress. She has no wheezes.  Abdominal: Soft. Bowel sounds are normal. There is no hepatosplenomegaly. There is no tenderness. There is no CVA tenderness.  Musculoskeletal: Normal range of motion.  Lymphadenopathy:    She has no cervical adenopathy.    She has no axillary adenopathy.  Neurological: She is alert and oriented to person, place, and time. She has normal reflexes. No cranial nerve deficit or sensory deficit.  Skin: Skin is warm, dry and intact. No rash noted.  Psychiatric: She has a normal mood and affect. Her speech is normal and behavior is normal. Thought content normal.  Nursing note and vitals reviewed.   BP 122/68 (BP Location: Left Arm, Patient Position: Sitting, Cuff Size: Normal)   Pulse 70   Ht '5\' 5"'$  (1.651 m)   Wt 166 lb (75.3 kg)   SpO2 98%   BMI 27.62 kg/m   Assessment and Plan: 1. Annual physical exam Weight at goal Continue follow up with GYN - POCT urinalysis dipstick  2. Anxiety disorder due to known physiological condition Controlled on Lexapro  3. Hyperlipidemia, mild Labs done by [facility] employer today  4. Irritant contact dermatitis, unspecified trigger Continue cortisone topical  5. Fatigue, unspecified type - CBC with Differential/Platelet - TSH - ANA w/Reflex if Positive - Vitamin B12 - Sedimentation rate   No orders of the defined types were placed in this encounter.   Deborah Maidens, MD [facility]  08/29/2016

## 2016-08-30 LAB — CBC WITH DIFFERENTIAL/PLATELET
BASOS: 1 %
Basophils Absolute: 0 10*3/uL (ref 0.0–0.2)
EOS (ABSOLUTE): 0.2 10*3/uL (ref 0.0–0.4)
EOS: 2 %
HEMATOCRIT: 41.4 % (ref 34.0–46.6)
Hemoglobin: 14.3 g/dL (ref 11.1–15.9)
IMMATURE GRANULOCYTES: 0 %
Immature Grans (Abs): 0 10*3/uL (ref 0.0–0.1)
LYMPHS ABS: 2.4 10*3/uL (ref 0.7–3.1)
Lymphs: 35 %
MCH: 30.8 pg (ref 26.6–33.0)
MCHC: 34.5 g/dL (ref 31.5–35.7)
MCV: 89 fL (ref 79–97)
MONOS ABS: 0.5 10*3/uL (ref 0.1–0.9)
Monocytes: 7 %
NEUTROS PCT: 55 %
Neutrophils Absolute: 3.9 10*3/uL (ref 1.4–7.0)
PLATELETS: 204 10*3/uL (ref 150–379)
RBC: 4.65 x10E6/uL (ref 3.77–5.28)
RDW: 12.8 % (ref 12.3–15.4)
WBC: 7 10*3/uL (ref 3.4–10.8)

## 2016-08-30 LAB — TSH: TSH: 0.949 u[IU]/mL (ref 0.450–4.500)

## 2016-08-30 LAB — SEDIMENTATION RATE: Sed Rate: 2 mm/hr (ref 0–32)

## 2016-08-30 LAB — ANA W/REFLEX IF POSITIVE: Anti Nuclear Antibody(ANA): NEGATIVE

## 2016-08-30 LAB — VITAMIN B12: Vitamin B-12: 558 pg/mL (ref 232–1245)

## 2017-01-27 LAB — HM PAP SMEAR: HM Pap smear: NEGATIVE

## 2017-01-27 LAB — HM MAMMOGRAPHY

## 2017-01-30 ENCOUNTER — Telehealth: Payer: Self-pay

## 2017-01-30 NOTE — Telephone Encounter (Signed)
I think that is too dangerous a condition to treat without being seen.  If she can not get off of work, then I suggest the Urgent care.

## 2017-01-30 NOTE — Telephone Encounter (Signed)
Patient calling stated she seen Dr Army Melia before for cellultiis on her face. Works three weeks in [employer] so cannot make OV. Wants to know if she can be re-prescribed for the antibiotic that was prescribed for cellulitis of her face. She is having another outbreak.  Please Advise.

## 2017-01-30 NOTE — Telephone Encounter (Signed)
Patient informed.  Verbalized understanding.

## 2017-08-31 ENCOUNTER — Encounter: Payer: Self-pay | Admitting: Internal Medicine

## 2017-11-03 LAB — LIPID PANEL
CHOLESTEROL: 192 (ref 0–200)
HDL: 52 (ref 35–70)
LDL Cholesterol: 118
Triglycerides: 108 (ref 40–160)

## 2017-11-03 LAB — HEMOGLOBIN A1C: HEMOGLOBIN A1C: 4.8

## 2017-11-03 LAB — BASIC METABOLIC PANEL
Creatinine: 0.9 (ref 0.5–1.1)
Glucose: 80

## 2018-01-27 ENCOUNTER — Ambulatory Visit (INDEPENDENT_AMBULATORY_CARE_PROVIDER_SITE_OTHER): Payer: 59 | Admitting: Internal Medicine

## 2018-01-27 ENCOUNTER — Encounter: Payer: Self-pay | Admitting: Internal Medicine

## 2018-01-27 VITALS — BP 118/70 | HR 64 | Ht 65.0 in | Wt 168.0 lb

## 2018-01-27 DIAGNOSIS — Z0000 Encounter for general adult medical examination without abnormal findings: Secondary | ICD-10-CM

## 2018-01-27 DIAGNOSIS — L708 Other acne: Secondary | ICD-10-CM

## 2018-01-27 DIAGNOSIS — R002 Palpitations: Secondary | ICD-10-CM

## 2018-01-27 DIAGNOSIS — F064 Anxiety disorder due to known physiological condition: Secondary | ICD-10-CM

## 2018-01-27 LAB — POCT URINALYSIS DIPSTICK
Bilirubin, UA: NEGATIVE
Blood, UA: NEGATIVE
GLUCOSE UA: NEGATIVE
KETONES UA: NEGATIVE
LEUKOCYTES UA: NEGATIVE
Nitrite, UA: NEGATIVE
PROTEIN UA: NEGATIVE
SPEC GRAV UA: 1.015 (ref 1.010–1.025)
Urobilinogen, UA: 0.2 E.U./dL
pH, UA: 6 (ref 5.0–8.0)

## 2018-01-27 NOTE — Progress Notes (Signed)
Date:  01/27/2018   Name:  Deborah Mills   DOB:  03-26-1975   MRN:  [id number]   Chief Complaint: Annual Exam Deborah Mills is a 43 y.o. female who presents today for her Complete Annual Exam. She feels fairly well. She reports exercising regularly. She reports she is sleeping well. She is maintaining her weight loss from last year. Pt continues to see GYN for pap and pelvic exams as well as mammograms.  She has no menses after cryoablation in 2010. She has had excellent improvement in constipation issues drinking an herbal tea daily.  Anxiety  Presents for follow-up visit. Symptoms include palpitations. Patient reports no chest pain, dizziness, nervous/anxious behavior or shortness of breath. Symptoms occur rarely. The severity of symptoms is mild.   Compliance with medications: doing well on Lexapro, takes xanax 1-2 times per month.  Palpitations   This is a recurrent problem. The problem occurs rarely. The problem has been gradually improving. Pertinent negatives include no anxiety, chest pain, coughing, dizziness, fever, shortness of breath or vomiting.    Review of Systems  Constitutional: Negative for chills, fatigue and fever.  HENT: Negative for congestion, hearing loss, tinnitus, trouble swallowing and voice change.   Eyes: Negative for visual disturbance.  Respiratory: Negative for cough, chest tightness, shortness of breath and wheezing.   Cardiovascular: Positive for palpitations. Negative for chest pain and leg swelling.  Gastrointestinal: Negative for abdominal pain, constipation, diarrhea and vomiting.  Endocrine: Negative for polydipsia and polyuria.  Genitourinary: Negative for dysuria, frequency, genital sores, vaginal bleeding and vaginal discharge.  Musculoskeletal: Negative for arthralgias, gait problem and joint swelling.  Skin: Negative for color change and rash.  Neurological: Negative for dizziness, tremors, light-headedness and headaches.    Hematological: Negative for adenopathy. Does not bruise/bleed easily.  Psychiatric/Behavioral: Negative for dysphoric mood and sleep disturbance. The patient is not nervous/anxious.     Patient Active Problem List   Diagnosis Date Noted  . Hyperlipidemia, mild 08/29/2015  . Anxiety disorder due to known physiological condition 10/27/2014  . Awareness of heartbeats 10/27/2014  . Degeneration of intervertebral disc of cervical region 10/27/2014  . Headache, migraine 10/27/2014    No Known Allergies  Past Surgical History:  Procedure Laterality Date  . GANGLION CYST EXCISION    . ROTATOR CUFF REPAIR    . Uterine ablation  2010    Social History   Tobacco Use  . Smoking status: Never Smoker  . Smokeless tobacco: Never Used  Substance Use Topics  . Alcohol use: No    Alcohol/week: 0.0 standard drinks  . Drug use: Not on file     Medication list has been reviewed and updated.  Current Meds  Medication Sig  . ALPRAZolam (XANAX) 0.25 MG tablet Take 1 tablet (0.25 mg total) by mouth 2 (two) times daily as needed for anxiety.  Marland Kitchen escitalopram (LEXAPRO) 20 MG tablet Take 20 mg by mouth daily.  Marland Kitchen spironolactone (ALDACTONE) 25 MG tablet Take 12.5 mg by mouth daily. Dr [facility]- [facility]    PHQ 2/9 Scores 01/27/2018 08/29/2016 08/29/2015 08/29/2015  PHQ - 2 Score 0 0 0 0    Physical Exam Vitals signs and nursing note reviewed.  Constitutional:      General: She is not in acute distress.    Appearance: She is well-developed.  HENT:     Head: Normocephalic and atraumatic.     Right Ear: Tympanic membrane and ear canal normal.     Left  Ear: Tympanic membrane and ear canal normal.     Nose:     Right Sinus: No maxillary sinus tenderness.     Left Sinus: No maxillary sinus tenderness.     Mouth/Throat:     Pharynx: Uvula midline.  Eyes:     General: No scleral icterus.       Right eye: No discharge.        Left eye: No discharge.     Conjunctiva/sclera: Conjunctivae  normal.  Neck:     Musculoskeletal: Normal range of motion. No erythema.     Thyroid: No thyromegaly.     Vascular: No carotid bruit.  Cardiovascular:     Rate and Rhythm: Normal rate and regular rhythm.     Pulses: Normal pulses.     Heart sounds: Normal heart sounds.  Pulmonary:     Effort: Pulmonary effort is normal. No respiratory distress.     Breath sounds: No wheezing.  Abdominal:     General: Bowel sounds are normal.     Palpations: Abdomen is soft.     Tenderness: There is no abdominal tenderness.  Musculoskeletal: Normal range of motion.  Lymphadenopathy:     Cervical: No cervical adenopathy.  Skin:    General: Skin is warm and dry.     Findings: No acne, erythema or rash.  Neurological:     Mental Status: She is alert and oriented to person, place, and time.     Cranial Nerves: No cranial nerve deficit.     Sensory: No sensory deficit.     Deep Tendon Reflexes: Reflexes are normal and symmetric.  Psychiatric:        Speech: Speech normal.        Behavior: Behavior normal.        Thought Content: Thought content normal.    Wt Readings from Last 3 Encounters:  01/27/18 168 lb (76.2 kg)  08/29/16 166 lb (75.3 kg)  10/04/15 173 lb (78.5 kg)    BP 118/70 (BP Location: Right Arm, Patient Position: Sitting, Cuff Size: Normal)   Pulse 64   Ht 5\' 5"  (1.651 m)   Wt 168 lb (76.2 kg)   SpO2 98%   BMI 27.96 kg/m   Assessment and Plan: 1. Annual physical exam Normal exam Continue healthy diet and regular exercise - CBC with Differential/Platelet - Comprehensive metabolic panel - POCT urinalysis dipstick  2. Anxiety disorder due to known physiological condition Controlled on Lexapro daily with xanax PRN  3. Awareness of heartbeats Intermittent and only minimally sx - TSH  4. Other acne Now on spironolactone Check labs   Partially dictated using Dragon software. Any errors are unintentional.  Halina Maidens, MD [facility]  01/27/2018

## 2018-01-27 NOTE — Patient Instructions (Signed)

## 2018-01-28 ENCOUNTER — Encounter: Payer: Self-pay | Admitting: Internal Medicine

## 2018-01-28 LAB — COMPREHENSIVE METABOLIC PANEL
ALBUMIN: 4.8 g/dL (ref 3.5–5.5)
ALT: 12 IU/L (ref 0–32)
AST: 18 IU/L (ref 0–40)
Albumin/Globulin Ratio: 2 (ref 1.2–2.2)
Alkaline Phosphatase: 59 IU/L (ref 39–117)
BUN / CREAT RATIO: 20 (ref 9–23)
BUN: 17 mg/dL (ref 6–24)
Bilirubin Total: 0.5 mg/dL (ref 0.0–1.2)
CO2: 22 mmol/L (ref 20–29)
CREATININE: 0.84 mg/dL (ref 0.57–1.00)
Calcium: 9.8 mg/dL (ref 8.7–10.2)
Chloride: 104 mmol/L (ref 96–106)
GFR, EST AFRICAN AMERICAN: 98 mL/min/{1.73_m2} (ref >59)
GFR, EST NON AFRICAN AMERICAN: 85 mL/min/{1.73_m2} (ref >59)
GLUCOSE: 76 mg/dL (ref 65–99)
Globulin, Total: 2.4 g/dL (ref 1.5–4.5)
Potassium: 4.1 mmol/L (ref 3.5–5.2)
SODIUM: 140 mmol/L (ref 134–144)
TOTAL PROTEIN: 7.2 g/dL (ref 6.0–8.5)

## 2018-01-28 LAB — CBC WITH DIFFERENTIAL/PLATELET
Basophils Absolute: 0.1 10*3/uL (ref 0.0–0.2)
Basos: 1 %
EOS (ABSOLUTE): 0.7 10*3/uL — ABNORMAL HIGH (ref 0.0–0.4)
Eos: 9 %
HEMOGLOBIN: 16 g/dL — AB (ref 11.1–15.9)
Hematocrit: 45.1 % (ref 34.0–46.6)
IMMATURE GRANS (ABS): 0 10*3/uL (ref 0.0–0.1)
Immature Granulocytes: 0 %
LYMPHS: 28 %
Lymphocytes Absolute: 2.2 10*3/uL (ref 0.7–3.1)
MCH: 31.9 pg (ref 26.6–33.0)
MCHC: 35.5 g/dL (ref 31.5–35.7)
MCV: 90 fL (ref 79–97)
MONOCYTES: 6 %
Monocytes Absolute: 0.5 10*3/uL (ref 0.1–0.9)
NEUTROS PCT: 56 %
Neutrophils Absolute: 4.3 10*3/uL (ref 1.4–7.0)
Platelets: 225 10*3/uL (ref 150–450)
RBC: 5.02 x10E6/uL (ref 3.77–5.28)
RDW: 11.6 % — ABNORMAL LOW (ref 11.7–15.4)
WBC: 7.7 10*3/uL (ref 3.4–10.8)

## 2018-01-28 LAB — TSH: TSH: 1.26 u[IU]/mL (ref 0.450–4.500)

## 2018-01-28 NOTE — Progress Notes (Unsigned)
[id number] 

## 2019-02-01 ENCOUNTER — Ambulatory Visit (INDEPENDENT_AMBULATORY_CARE_PROVIDER_SITE_OTHER): Payer: No Typology Code available for payment source | Admitting: Internal Medicine

## 2019-02-01 ENCOUNTER — Encounter: Payer: Self-pay | Admitting: Internal Medicine

## 2019-02-01 ENCOUNTER — Other Ambulatory Visit: Payer: Self-pay

## 2019-02-01 VITALS — BP 126/82 | HR 85 | Ht 65.0 in | Wt 171.0 lb

## 2019-02-01 DIAGNOSIS — Z0000 Encounter for general adult medical examination without abnormal findings: Secondary | ICD-10-CM

## 2019-02-01 DIAGNOSIS — F064 Anxiety disorder due to known physiological condition: Secondary | ICD-10-CM

## 2019-02-01 DIAGNOSIS — E785 Hyperlipidemia, unspecified: Secondary | ICD-10-CM | POA: Diagnosis not present

## 2019-02-01 LAB — POCT URINALYSIS DIPSTICK
Bilirubin, UA: NEGATIVE
Blood, UA: NEGATIVE
Glucose, UA: NEGATIVE
Ketones, UA: NEGATIVE
Leukocytes, UA: NEGATIVE
Nitrite, UA: NEGATIVE
Protein, UA: NEGATIVE
Spec Grav, UA: <=1.005 — AB (ref 1.010–1.025)
Urobilinogen, UA: 0.2 E.U./dL
pH, UA: 6 (ref 5.0–8.0)

## 2019-02-01 NOTE — Progress Notes (Signed)
Date:  02/01/2019   Name:  Deborah Mills   DOB:  03-18-1975   MRN:  [id number]   Chief Complaint: Annual Exam (Breast exam and no pap. Appt with Dr Corinna Capra Feb 2nd for [facility] and Mammogram. ) Deborah Mills is a 44 y.o. female who presents today for her Complete Annual Exam. She feels well. She reports exercising regularly. She reports she is sleeping well. She denies breast issues.  Mammogram 01/2017 Pap  01/2017  Immunization History  Administered Date(s) Administered  . Influenza-Unspecified 12/04/2017, 12/10/2018  . Moderna SARS-COVID-2 Vaccination 01/12/2019  . Tdap 11/03/2014    Anxiety Presents for follow-up visit. Symptoms include nervous/anxious behavior and palpitations. Patient reports no chest pain, dizziness or shortness of breath. Symptoms occur rarely. The quality of sleep is good.   Compliance with medications is 76-100% (lexapro 20 mg).    Lab Results  Component Value Date   CREATININE 0.84 01/27/2018   BUN 17 01/27/2018   NA 140 01/27/2018   K 4.1 01/27/2018   CL 104 01/27/2018   CO2 22 01/27/2018   Lab Results  Component Value Date   CHOL 192 11/03/2017   HDL 52 11/03/2017   LDLCALC 118 11/03/2017   TRIG 108 11/03/2017   Lab Results  Component Value Date   TSH 1.260 01/27/2018   Lab Results  Component Value Date   HGBA1C 4.8 11/03/2017     Review of Systems  Constitutional: Negative for chills, fatigue and fever.  HENT: Negative for congestion, hearing loss, tinnitus, trouble swallowing and voice change.   Eyes: Negative for visual disturbance.  Respiratory: Negative for cough, chest tightness, shortness of breath and wheezing.   Cardiovascular: Positive for palpitations. Negative for chest pain and leg swelling.  Gastrointestinal: Positive for constipation. Negative for abdominal pain, diarrhea and vomiting.  Endocrine: Negative for polydipsia and polyuria.  Genitourinary: Negative for dysuria, frequency, genital sores, vaginal bleeding  and vaginal discharge.  Musculoskeletal: Positive for arthralgias (plantar fasciitis). Negative for gait problem and joint swelling.  Skin: Negative for color change and rash.  Neurological: Negative for dizziness, tremors, light-headedness and headaches.  Hematological: Negative for adenopathy. Does not bruise/bleed easily.  Psychiatric/Behavioral: Negative for dysphoric mood and sleep disturbance. The patient is nervous/anxious.     Patient Active Problem List   Diagnosis Date Noted  . Other acne 01/27/2018  . Hyperlipidemia, mild 08/29/2015  . Anxiety disorder due to known physiological condition 10/27/2014  . Awareness of heartbeats 10/27/2014  . Degeneration of intervertebral disc of cervical region 10/27/2014  . Headache, migraine 10/27/2014    No Known Allergies  Past Surgical History:  Procedure Laterality Date  . GANGLION CYST EXCISION    . ROTATOR CUFF REPAIR    . Uterine ablation  2010    Social History   Tobacco Use  . Smoking status: Never Smoker  . Smokeless tobacco: Never Used  Substance Use Topics  . Alcohol use: No    Alcohol/week: 0.0 standard drinks  . Drug use: Not on file     Medication list has been reviewed and updated.  Current Meds  Medication Sig  . ALPRAZolam (XANAX) 0.25 MG tablet Take 1 tablet (0.25 mg total) by mouth 2 (two) times daily as needed for anxiety.  Marland Kitchen escitalopram (LEXAPRO) 20 MG tablet Take 20 mg by mouth daily.  . meloxicam (MOBIC) 7.5 MG tablet Take 15 mg by mouth daily.   Marland Kitchen spironolactone (ALDACTONE) 50 MG tablet Take 50 mg by mouth daily. Dr  [facility]- [facility]ermatology [facility]     PHQ 2/9 Scores 02/01/2019 01/27/2018 08/29/2016 08/29/2015  PHQ - 2 Score 0 0 0 0  PHQ- 9 Score 0 - - -    BP Readings from Last 3 Encounters:  02/01/19 126/82  01/27/18 118/70  08/29/16 122/68    Physical Exam Vitals and nursing note reviewed.  Constitutional:      General: She is not in acute distress.    Appearance: She is well-developed.    HENT:     Head: Normocephalic and atraumatic.     Right Ear: Tympanic membrane and ear canal normal.     Left Ear: Tympanic membrane and ear canal normal.     Nose:     Right Sinus: No maxillary sinus tenderness.     Left Sinus: No maxillary sinus tenderness.  Eyes:     General: No scleral icterus.       Right eye: No discharge.        Left eye: No discharge.     Conjunctiva/sclera: Conjunctivae normal.  Neck:     Thyroid: No thyromegaly.     Vascular: No carotid bruit.  Cardiovascular:     Rate and Rhythm: Normal rate and regular rhythm.     Pulses: Normal pulses.     Heart sounds: Normal heart sounds.  Pulmonary:     Effort: Pulmonary effort is normal. No respiratory distress.     Breath sounds: No wheezing.  Chest:     Breasts:        Right: No mass, nipple discharge, skin change or tenderness.        Left: No mass, nipple discharge, skin change or tenderness.  Abdominal:     General: Bowel sounds are normal.     Palpations: Abdomen is soft.     Tenderness: There is no abdominal tenderness.  Musculoskeletal:        General: Normal range of motion.     Cervical back: Normal range of motion. No erythema.     Right lower leg: No edema.     Left lower leg: No edema.  Lymphadenopathy:     Cervical: No cervical adenopathy.  Skin:    General: Skin is warm and dry.     Capillary Refill: Capillary refill takes less than 2 seconds.     Findings: No rash.  Neurological:     General: No focal deficit present.     Mental Status: She is alert and oriented to person, place, and time.     Cranial Nerves: No cranial nerve deficit.     Sensory: No sensory deficit.     Deep Tendon Reflexes: Reflexes are normal and symmetric.  Psychiatric:        Attention and Perception: Attention normal.        Speech: Speech normal.        Behavior: Behavior normal.        Thought Content: Thought content normal.     Wt Readings from Last 3 Encounters:  02/01/19 171 lb (77.6 kg)  01/27/18  168 lb (76.2 kg)  08/29/16 166 lb (75.3 kg)    BP 126/82   Pulse 85   Ht 5\' 5"  (1.651 m)   Wt 171 lb (77.6 kg)   LMP 01/20/2018 (Exact Date)   SpO2 99%   BMI 28.46 kg/m   Assessment and Plan: 1. Annual physical exam Normal exam Continue healthy diet, regular exercise - CBC with Differential/Platelet - Comprehensive metabolic panel - TSH - POCT  urinalysis dipstick  2. Anxiety disorder due to known physiological condition Clinically stable and doing well on Lexapro prescribed by GYN Has not needed or use Alprazolam in many months  3. Hyperlipidemia, mild Check labs and advise. - Lipid panel   Partially dictated using Editor, commissioning. Any errors are unintentional.  Halina Maidens, MD [facility]  02/01/2019

## 2019-02-10 ENCOUNTER — Telehealth: Payer: Self-pay

## 2019-02-10 NOTE — Telephone Encounter (Signed)
Called pt and reminded her to go get her labs done from her CPE.   Left msg.

## 2019-02-15 LAB — COMPREHENSIVE METABOLIC PANEL
ALT: 26 IU/L (ref 0–32)
AST: 51 IU/L — ABNORMAL HIGH (ref 0–40)
Albumin/Globulin Ratio: 1.6 (ref 1.2–2.2)
Albumin: 4.1 g/dL (ref 3.8–4.8)
Alkaline Phosphatase: 46 IU/L (ref 39–117)
BUN/Creatinine Ratio: 19 (ref 9–23)
BUN: 15 mg/dL (ref 6–24)
Bilirubin Total: 0.4 mg/dL (ref 0.0–1.2)
CO2: 23 mmol/L (ref 20–29)
Calcium: 9.1 mg/dL (ref 8.7–10.2)
Chloride: 104 mmol/L (ref 96–106)
Creatinine, Ser: 0.81 mg/dL (ref 0.57–1.00)
GFR calc Af Amer: 101 mL/min/{1.73_m2} (ref >59)
GFR calc non Af Amer: 88 mL/min/{1.73_m2} (ref >59)
Globulin, Total: 2.6 g/dL (ref 1.5–4.5)
Glucose: 93 mg/dL (ref 65–99)
Potassium: 4 mmol/L (ref 3.5–5.2)
Sodium: 137 mmol/L (ref 134–144)
Total Protein: 6.7 g/dL (ref 6.0–8.5)

## 2019-02-15 LAB — LIPID PANEL
Chol/HDL Ratio: 2.7 ratio (ref 0.0–4.4)
Cholesterol, Total: 177 mg/dL (ref 100–199)
HDL: 65 mg/dL (ref >39)
LDL Chol Calc (NIH): 97 mg/dL (ref 0–99)
Triglycerides: 81 mg/dL (ref 0–149)
VLDL Cholesterol Cal: 15 mg/dL (ref 5–40)

## 2019-02-15 LAB — CBC WITH DIFFERENTIAL/PLATELET
Basophils Absolute: 0 10*3/uL (ref 0.0–0.2)
Basos: 1 %
EOS (ABSOLUTE): 0.2 10*3/uL (ref 0.0–0.4)
Eos: 6 %
Hematocrit: 39.9 % (ref 34.0–46.6)
Hemoglobin: 14.3 g/dL (ref 11.1–15.9)
Immature Grans (Abs): 0 10*3/uL (ref 0.0–0.1)
Immature Granulocytes: 0 %
Lymphocytes Absolute: 1.4 10*3/uL (ref 0.7–3.1)
Lymphs: 34 %
MCH: 31.6 pg (ref 26.6–33.0)
MCHC: 35.8 g/dL — ABNORMAL HIGH (ref 31.5–35.7)
MCV: 88 fL (ref 79–97)
Monocytes Absolute: 0.5 10*3/uL (ref 0.1–0.9)
Monocytes: 11 %
Neutrophils Absolute: 2 10*3/uL (ref 1.4–7.0)
Neutrophils: 48 %
Platelets: 199 10*3/uL (ref 150–450)
RBC: 4.52 x10E6/uL (ref 3.77–5.28)
RDW: 11.7 % (ref 11.7–15.4)
WBC: 4.2 10*3/uL (ref 3.4–10.8)

## 2019-02-15 LAB — TSH: TSH: 0.857 u[IU]/mL (ref 0.450–4.500)

## 2019-06-05 ENCOUNTER — Other Ambulatory Visit: Payer: Self-pay | Admitting: Dermatology

## 2019-08-08 ENCOUNTER — Other Ambulatory Visit: Payer: Self-pay

## 2019-08-08 ENCOUNTER — Ambulatory Visit: Payer: No Typology Code available for payment source | Admitting: Dermatology

## 2019-08-08 DIAGNOSIS — Z1283 Encounter for screening for malignant neoplasm of skin: Secondary | ICD-10-CM

## 2019-08-08 DIAGNOSIS — Z86018 Personal history of other benign neoplasm: Secondary | ICD-10-CM

## 2019-08-08 DIAGNOSIS — S30860A Insect bite (nonvenomous) of lower back and pelvis, initial encounter: Secondary | ICD-10-CM

## 2019-08-08 DIAGNOSIS — I781 Nevus, non-neoplastic: Secondary | ICD-10-CM

## 2019-08-08 DIAGNOSIS — D1800 Hemangioma unspecified site: Secondary | ICD-10-CM

## 2019-08-08 DIAGNOSIS — L578 Other skin changes due to chronic exposure to nonionizing radiation: Secondary | ICD-10-CM

## 2019-08-08 DIAGNOSIS — L821 Other seborrheic keratosis: Secondary | ICD-10-CM

## 2019-08-08 DIAGNOSIS — S1096XA Insect bite of unspecified part of neck, initial encounter: Secondary | ICD-10-CM

## 2019-08-08 DIAGNOSIS — D229 Melanocytic nevi, unspecified: Secondary | ICD-10-CM

## 2019-08-08 DIAGNOSIS — D225 Melanocytic nevi of trunk: Secondary | ICD-10-CM

## 2019-08-08 DIAGNOSIS — L700 Acne vulgaris: Secondary | ICD-10-CM | POA: Diagnosis not present

## 2019-08-08 DIAGNOSIS — W57XXXA Bitten or stung by nonvenomous insect and other nonvenomous arthropods, initial encounter: Secondary | ICD-10-CM

## 2019-08-08 DIAGNOSIS — L814 Other melanin hyperpigmentation: Secondary | ICD-10-CM

## 2019-08-08 MED ORDER — SPIRONOLACTONE 100 MG PO TABS
100.0000 mg | ORAL_TABLET | Freq: Every day | ORAL | 1 refills | Status: DC
Start: 2019-08-08 — End: 2020-02-16

## 2019-08-08 MED ORDER — ADAPALENE-BENZOYL PEROXIDE 0.1-2.5 % EX GEL
1.0000 | Freq: Every day | CUTANEOUS | 5 refills | Status: DC
Start: 2019-08-08 — End: 2020-08-22

## 2019-08-08 MED ORDER — TRIAMCINOLONE ACETONIDE 0.1 % EX CREA: TOPICAL_CREAM | CUTANEOUS | 0 refills | Status: DC

## 2019-08-08 NOTE — Progress Notes (Signed)
Follow-Up Visit   Subjective  Deborah Mills is a 44 y.o. female who presents for the following: Annual Exam.  Patient here today for TBSE. She has a history of something removed at the left lower leg 2006/7 but no pathology could be located.  Probable dysplastic nevus. She is not aware of anything new or changing. She also has acne and needs rfs of medications, spironolactone 100 mg daily, No side effects, and generic epiduo.  She has some spots that she tends to pick at on the upper back, possible bug bites?  The following portions of the chart were reviewed this encounter and updated as appropriate:      Review of Systems:  No other skin or systemic complaints except as noted in HPI or Assessment and Plan.  Objective  Well appearing patient in no apparent distress; mood and affect are within normal limits.  A full examination was performed including scalp, head, eyes, ears, nose, lips, neck, chest, axillae, abdomen, back, buttocks, bilateral upper extremities, bilateral lower extremities, hands, feet, fingers, toes, fingernails, and toenails. All findings within normal limits unless otherwise noted below.  Objective  Left Lower Leg: Linear scar- clear   Objective  Neck - Posterior: Multiple pink excoriated papules at upper back, pt reports picking at areas  Objective  Left Upper Flank: 6 x 74mm speckled brown papule  Left Upper Breast: 69mm medium dark brown macule with notch- present for years, no changes per pt  Objective  Nasal tip: 53mm blanching pink macule  Objective  Face: Resolving inflammatory papules at inferior chin x 2 BP 131/90   Assessment & Plan  History of dysplastic nevus Left Lower Leg  Excised 2006-2007  No evidence of recurrence, call clinic for new or changing lesions.   Insect bite of lower back with local reaction, initial encounter Neck - Posterior  With excoriations Start TMC 0.1% cream to affected areas 1-2 times daily as needed  for itch. Avoid face, groin, axilla.  Avoid picking May use OTC antibiotic ointment qd to open sores until healed  Ordered Medications: triamcinolone cream (KENALOG) 0.1 %  Nevus (2) Left Upper Breast; Left Upper Flank  Benign-appearing.  Observation.  Call clinic for new or changing moles.  Recommend daily use of broad spectrum spf 30+ sunscreen to sun-exposed areas.    Telangiectasia Nasal tip  Benign, observe.   Discussed BBL laser treatment.   Acne vulgaris Face  Controlled Continue spironolactone 100mg  1 PO QD  Continue generic Epiduo daily as needed    Spironolactone can cause increased urination and cause blood pressure to decrease. Please watch for signs of lightheadedness and be cautious when changing position. It can sometimes cause breast tenderness or an irregular period in premenopausal women. It can also increase potassium. The increase in potassium usually is not a concern unless you are taking other medicines that also increase potassium, so please be sure your doctor knows all of the other medications you are taking. This medication should not be taken  by pregnant women.  Topical retinoid medications like tretinoin/Retin-A, adapalene/Differin, tazarotene/Fabior, and Epiduo/Epiduo Forte can cause dryness and irritation when first started. Only apply a pea-sized amount to the entire affected area. Avoid applying it around the eyes, edges of mouth and creases at the nose. If you experience irritation, use a good moisturizer first and/or apply the medicine less often. If you are doing well with the medicine, you can increase how often you use it until you are applying every night. Be  careful with sun protection while using this medication as it can make you sensitive to the sun. This medicine should not be used by pregnant women.   Benzoyl peroxide can cause dryness and irritation of the skin. It can also bleach fabric. When used together with Aczone (dapsone) cream, it  can stain the skin orange.    Adapalene-Benzoyl Peroxide (EPIDUO) 0.1-2.5 % gel - Face  spironolactone (ALDACTONE) 100 MG tablet - Face   Lentigines - Scattered tan macules - Discussed due to sun exposure - Benign, observe - Call for any changes  Seborrheic Keratoses - Stuck-on, waxy, tan-brown papules and plaques  - Discussed benign etiology and prognosis. - Observe - Call for any changes  Melanocytic Nevi - Tan-brown and/or pink-flesh-colored symmetric macules and papules back. Cluster small brown macules at right flank at braline, left posterior hip. Benign appearing on exam today - Observation - Call clinic for new or changing moles - Recommend daily use of broad spectrum spf 30+ sunscreen to sun-exposed areas.   Hemangiomas - Red papules, L upper eyelid - Discussed benign nature - Observe - Call for any changes  Actinic Damage - diffuse scaly erythematous macules with underlying dyspigmentation - Recommend daily broad spectrum sunscreen SPF 30+ to sun-exposed areas, reapply every 2 hours as needed.  - Call for new or changing lesions.  Skin cancer screening performed today.   Return in about 1 year (around 08/07/2020) for TBSE.  Graciella Belton, RMA, am acting as scribe for Brendolyn Patty, MD . Documentation: I have reviewed the above documentation for accuracy and completeness, and I agree with the above.  Brendolyn Patty MD

## 2019-08-08 NOTE — Patient Instructions (Addendum)
Melanoma ABCDEs  Melanoma is the most dangerous type of skin cancer, and is the leading cause of death from skin disease.  You are more likely to develop melanoma if you:  Have light-colored skin, light-colored eyes, or red or blond hair  Spend a lot of time in the sun  Tan regularly, either outdoors or in a tanning bed  Have had blistering sunburns, especially during childhood  Have a close family member who has had a melanoma  Have atypical moles or large birthmarks  Early detection of melanoma is key since treatment is typically straightforward and cure rates are extremely high if we catch it early.   The first sign of melanoma is often a change in a mole or a new dark spot.  The ABCDE system is a way of remembering the signs of melanoma.  A for asymmetry:  The two halves do not match. B for border:  The edges of the growth are irregular. C for color:  A mixture of colors are present instead of an even brown color. D for diameter:  Melanomas are usually (but not always) greater than 6mm - the size of a pencil eraser. E for evolution:  The spot keeps changing in size, shape, and color.  Please check your skin once per month between visits. You can use a small mirror in front and a large mirror behind you to keep an eye on the back side or your body.   If you see any new or changing lesions before your next follow-up, please call to schedule a visit.  Please continue daily skin protection including broad spectrum sunscreen SPF 30+ to sun-exposed areas, reapplying every 2 hours as needed when you're outdoors.    Spironolactone can cause increased urination and cause blood pressure to decrease. Please watch for signs of lightheadedness and be cautious when changing position. It can sometimes cause breast tenderness or an irregular period in premenopausal women. It can also increase potassium. The increase in potassium usually is not a concern unless you are taking other medicines that  also increase potassium, so please be sure your doctor knows all of the other medications you are taking. This medication should not be taken  by pregnant women.   Topical retinoid medications like tretinoin/Retin-A, adapalene/Differin, tazarotene/Fabior, and Epiduo/Epiduo Forte can cause dryness and irritation when first started. Only apply a pea-sized amount to the entire affected area. Avoid applying it around the eyes, edges of mouth and creases at the nose. If you experience irritation, use a good moisturizer first and/or apply the medicine less often. If you are doing well with the medicine, you can increase how often you use it until you are applying every night. Be careful with sun protection while using this medication as it can make you sensitive to the sun. This medicine should not be used by pregnant women.   

## 2019-10-18 ENCOUNTER — Telehealth: Payer: Self-pay | Admitting: Internal Medicine

## 2019-10-18 NOTE — Telephone Encounter (Signed)
Noted  KP 

## 2019-10-18 NOTE — Telephone Encounter (Signed)
Lvm for patient to call back

## 2019-10-18 NOTE — Telephone Encounter (Signed)
Patient called to ask if it was possible to work her in for an appt. This morning or tomorrow or a possible yeast infection.  Schedule showed doctor does not have anything until next week.  Please advise and call patient back at [phone]

## 2019-10-20 ENCOUNTER — Other Ambulatory Visit: Payer: Self-pay | Admitting: Internal Medicine

## 2019-10-20 ENCOUNTER — Encounter: Payer: Self-pay | Admitting: Internal Medicine

## 2019-10-20 ENCOUNTER — Ambulatory Visit: Payer: No Typology Code available for payment source | Admitting: Internal Medicine

## 2019-10-20 ENCOUNTER — Other Ambulatory Visit: Payer: Self-pay

## 2019-10-20 VITALS — BP 118/78 | HR 77 | Ht 65.0 in | Wt 167.0 lb

## 2019-10-20 DIAGNOSIS — Z113 Encounter for screening for infections with a predominantly sexual mode of transmission: Secondary | ICD-10-CM | POA: Diagnosis not present

## 2019-10-20 DIAGNOSIS — N760 Acute vaginitis: Secondary | ICD-10-CM

## 2019-10-20 DIAGNOSIS — K649 Unspecified hemorrhoids: Secondary | ICD-10-CM | POA: Diagnosis not present

## 2019-10-20 DIAGNOSIS — Z1159 Encounter for screening for other viral diseases: Secondary | ICD-10-CM | POA: Diagnosis not present

## 2019-10-20 LAB — POCT WET PREP WITH KOH
KOH Prep POC: NEGATIVE
RBC Wet Prep HPF POC: 0
Trichomonas, UA: NEGATIVE
WBC Wet Prep HPF POC: 5
Yeast Wet Prep HPF POC: NEGATIVE

## 2019-10-20 MED ORDER — METRONIDAZOLE 500 MG PO TABS: 500.0000 mg | ORAL_TABLET | Freq: Two times a day (BID) | ORAL | 0 refills | Status: AC

## 2019-10-20 MED ORDER — FLUCONAZOLE 100 MG PO TABS: 100.0000 mg | ORAL_TABLET | Freq: Every day | ORAL | 0 refills | Status: AC

## 2019-10-20 NOTE — Progress Notes (Signed)
Date:  10/20/2019   Name:  Deborah Mills   DOB:  03/23/1975   MRN:  [id number]   Chief Complaint: Vaginal Itching (Working out and going to the gym more lately. Last few days - burning and itching on and off.  ) and STD Screening (HIV screening, chlym/gon, and syphillus test wanted because has some personal things going on at home. )  Vaginal Itching The patient's primary symptoms include genital itching and vaginal discharge (no significant discharge). The patient's pertinent negatives include no genital odor, genital rash or pelvic pain. This is a new problem. The current episode started 1 to 4 weeks ago. The problem occurs constantly. The problem has been unchanged. The patient is experiencing no pain. Associated symptoms include constipation and a sore throat. Pertinent negatives include no abdominal pain, chills, diarrhea, dysuria, fever or hematuria. She is sexually active. It is possible (some issues at home have her concerned) that her partner has an STD.   Hemorrhoids - has had issues off and on for years since her children were born.  They occasionally bleed and are bothersome in general.  She has never been evaluated but would like to see someone who can help with a non surgical approach.  Lab Results  Component Value Date   CREATININE 0.81 02/14/2019   BUN 15 02/14/2019   NA 137 02/14/2019   K 4.0 02/14/2019   CL 104 02/14/2019   CO2 23 02/14/2019   Lab Results  Component Value Date   CHOL 177 02/14/2019   HDL 65 02/14/2019   LDLCALC 97 02/14/2019   TRIG 81 02/14/2019   CHOLHDL 2.7 02/14/2019   Lab Results  Component Value Date   TSH 0.857 02/14/2019   Lab Results  Component Value Date   HGBA1C 4.8 11/03/2017   Lab Results  Component Value Date   WBC 4.2 02/14/2019   HGB 14.3 02/14/2019   HCT 39.9 02/14/2019   MCV 88 02/14/2019   PLT 199 02/14/2019   Lab Results  Component Value Date   ALT 26 02/14/2019   AST 51 (H) 02/14/2019   ALKPHOS 46 02/14/2019    BILITOT 0.4 02/14/2019     Review of Systems  Constitutional: Negative for chills, fatigue and fever.  HENT: Positive for sore throat. Negative for trouble swallowing.   Respiratory: Negative for cough, chest tightness and shortness of breath.   Cardiovascular: Negative for chest pain and palpitations.  Gastrointestinal: Positive for anal bleeding (and hemorrhoidal flareups for years) and constipation. Negative for abdominal pain and diarrhea.  Genitourinary: Positive for vaginal discharge (no significant discharge). Negative for difficulty urinating, dysuria, genital sores, hematuria, menstrual problem and pelvic pain.    Patient Active Problem List   Diagnosis Date Noted  . Other acne 01/27/2018  . Hyperlipidemia, mild 08/29/2015  . Anxiety disorder due to known physiological condition 10/27/2014  . Awareness of heartbeats 10/27/2014  . Degeneration of intervertebral disc of cervical region 10/27/2014  . Headache, migraine 10/27/2014    No Known Allergies  Past Surgical History:  Procedure Laterality Date  . GANGLION CYST EXCISION    . ROTATOR CUFF REPAIR    . Uterine ablation  2010    Social History   Tobacco Use  . Smoking status: Never Smoker  . Smokeless tobacco: Never Used  Substance Use Topics  . Alcohol use: No    Alcohol/week: 0.0 standard drinks  . Drug use: Not on file     Medication list has been reviewed and  updated.  Current Meds  Medication Sig  . Adapalene-Benzoyl Peroxide (EPIDUO) 0.1-2.5 % gel Apply 1 application topically at bedtime.  Marland Kitchen escitalopram (LEXAPRO) 20 MG tablet Take 20 mg by mouth daily.  . phentermine (ADIPEX-P) 37.5 MG tablet Take 37.5 mg by mouth daily.  Marland Kitchen spironolactone (ALDACTONE) 100 MG tablet Take 1 tablet (100 mg total) by mouth daily.  . valACYclovir (VALTREX) 1000 MG tablet Take 1,000 mg by mouth daily.    PHQ 2/9 Scores 02/01/2019 01/27/2018 08/29/2016 08/29/2015  PHQ - 2 Score 0 0 0 0  PHQ- 9 Score 0 - - -    No  flowsheet data found.  BP Readings from Last 3 Encounters:  10/20/19 118/78  02/01/19 126/82  01/27/18 118/70    Physical Exam Vitals and nursing note reviewed.  Constitutional:      General: She is not in acute distress.    Appearance: She is well-developed.  HENT:     Head: Normocephalic and atraumatic.     Mouth/Throat:     Lips: Pink.     Mouth: Mucous membranes are moist.     Tongue: No lesions.     Pharynx: Oropharynx is clear.  Cardiovascular:     Rate and Rhythm: Normal rate and regular rhythm.  Pulmonary:     Effort: Pulmonary effort is normal. No respiratory distress.     Breath sounds: No wheezing or rhonchi.  Abdominal:     General: Abdomen is flat.     Palpations: Abdomen is soft.  Genitourinary:    Labia:        Right: No tenderness, lesion or injury.        Left: No tenderness, lesion or injury.      Vagina: Normal.     Cervix: Normal.  Musculoskeletal:        General: Normal range of motion.     Cervical back: Normal range of motion.  Lymphadenopathy:     Cervical: No cervical adenopathy.  Skin:    General: Skin is warm and dry.     Findings: No rash.  Neurological:     Mental Status: She is alert and oriented to person, place, and time.  Psychiatric:        Behavior: Behavior normal.        Thought Content: Thought content normal.     Wt Readings from Last 3 Encounters:  10/20/19 167 lb (75.8 kg)  02/01/19 171 lb (77.6 kg)  01/27/18 168 lb (76.2 kg)    BP 118/78   Pulse 77   Ht 5\' 5"  (1.651 m)   Wt 167 lb (75.8 kg)   SpO2 97%   BMI 27.79 kg/m   Assessment and Plan: 1. Screening examination for STD (sexually transmitted disease) - HIV Antibody (routine testing w rflx) - RPR - GC/Chlamydia Probe Amp - GC/Chlamydia Probe Amp  2. Need for hepatitis C screening test - Hepatitis C antibody  3. Acute vaginitis Wet prep consistent with BV - POCT Wet Prep with KOH - fluconazole (DIFLUCAN) 100 MG tablet; Take 1 tablet (100 mg total)  by mouth daily for 3 days.  Dispense: 3 tablet; Refill: 0 - metroNIDAZOLE (FLAGYL) 500 MG tablet; Take 1 tablet (500 mg total) by mouth 2 (two) times daily for 7 days.  Dispense: 14 tablet; Refill: 0  4. Hemorrhoids, unspecified hemorrhoid type - Ambulatory referral to Gastroenterology   Partially dictated using [place]. Any errors are unintentional.  Halina Maidens, MD [facility]  10/20/2019      

## 2019-10-21 LAB — RPR: RPR Ser Ql: NONREACTIVE

## 2019-10-21 LAB — HIV ANTIBODY (ROUTINE TESTING W REFLEX): HIV Screen 4th Generation wRfx: NONREACTIVE

## 2019-10-21 LAB — HEPATITIS C ANTIBODY: Hep C Virus Ab: <0.1 s/co ratio (ref 0.0–0.9)

## 2019-10-23 LAB — GC/CHLAMYDIA PROBE AMP
Chlamydia trachomatis, NAA: NEGATIVE
Neisseria Gonorrhoeae by PCR: NEGATIVE

## 2019-10-25 ENCOUNTER — Encounter: Payer: Self-pay | Admitting: Internal Medicine

## 2019-10-25 LAB — GC/CHLAMYDIA PROBE AMP
Chlamydia trachomatis, NAA: NEGATIVE
Neisseria Gonorrhoeae by PCR: NEGATIVE

## 2019-10-26 ENCOUNTER — Encounter: Payer: Self-pay | Admitting: Internal Medicine

## 2019-10-28 ENCOUNTER — Encounter: Payer: Self-pay | Admitting: *Deleted

## 2019-10-28 ENCOUNTER — Ambulatory Visit: Payer: No Typology Code available for payment source | Admitting: Internal Medicine

## 2019-11-18 ENCOUNTER — Ambulatory Visit: Payer: No Typology Code available for payment source | Admitting: Internal Medicine

## 2020-01-06 ENCOUNTER — Other Ambulatory Visit: Payer: Self-pay

## 2020-01-06 ENCOUNTER — Ambulatory Visit: Payer: No Typology Code available for payment source | Admitting: Gastroenterology

## 2020-01-06 ENCOUNTER — Encounter: Payer: Self-pay | Admitting: Gastroenterology

## 2020-01-06 VITALS — BP 160/76 | HR 72 | Temp 97.9°F | Ht 65.0 in | Wt 176.4 lb

## 2020-01-06 DIAGNOSIS — K5904 Chronic idiopathic constipation: Secondary | ICD-10-CM | POA: Diagnosis not present

## 2020-01-06 DIAGNOSIS — Z1211 Encounter for screening for malignant neoplasm of colon: Secondary | ICD-10-CM

## 2020-01-06 DIAGNOSIS — K642 Third degree hemorrhoids: Secondary | ICD-10-CM | POA: Diagnosis not present

## 2020-01-06 MED ORDER — POLYETHYLENE GLYCOL 3350 17 GM/SCOOP PO POWD: 1.0000 | Freq: Once | ORAL | 0 refills | Status: AC

## 2020-01-06 MED ORDER — NA SULFATE-K SULFATE-MG SULF 17.5-3.13-1.6 GM/177ML PO SOLN: 354.0000 mL | Freq: Once | ORAL | 0 refills | Status: AC

## 2020-01-06 MED ORDER — MAGNESIUM CITRATE PO SOLN: 1.0000 | Freq: Once | ORAL | 0 refills | Status: AC

## 2020-01-06 NOTE — Patient Instructions (Signed)
1. Mix the miralax with Gatorade and drink 8oz every 20-30 minutes till solution is gone.  2. Gave Linzess 270mcg samples.    High-Fiber Diet Fiber, also called dietary fiber, is a type of carbohydrate that is found in fruits, vegetables, whole grains, and beans. A high-fiber diet can have many health benefits. Your health care provider may recommend a high-fiber diet to help:  Prevent constipation. Fiber can make your bowel movements more regular.  Lower your cholesterol.  Relieve the following conditions: ? Swelling of veins in the anus (hemorrhoids). ? Swelling and irritation (inflammation) of specific areas of the digestive tract (uncomplicated diverticulosis). ? A problem of the large intestine (colon) that sometimes causes pain and diarrhea (irritable bowel syndrome, IBS).  Prevent overeating as part of a weight-loss plan.  Prevent heart disease, type 2 diabetes, and certain cancers. What is my plan? The recommended daily fiber intake in grams (g) includes:  38 g for men age 31 or younger.  30 g for men over age 36.  62 g for women age 36 or younger.  21 g for women over age 80. You can get the recommended daily intake of dietary fiber by:  Eating a variety of fruits, vegetables, grains, and beans.  Taking a fiber supplement, if it is not possible to get enough fiber through your diet. What do I need to know about a high-fiber diet?  It is better to get fiber through food sources rather than from fiber supplements. There is not a lot of research about how effective supplements are.  Always check the fiber content on the nutrition facts label of any prepackaged food. Look for foods that contain 5 g of fiber or more per serving.  Talk with a diet and nutrition specialist (dietitian) if you have questions about specific foods that are recommended or not recommended for your medical condition, especially if those foods are not listed below.  Gradually increase how much  fiber you consume. If you increase your intake of dietary fiber too quickly, you may have bloating, cramping, or gas.  Drink plenty of water. Water helps you to digest fiber. What are tips for following this plan?  Eat a wide variety of high-fiber foods.  Make sure that half of the grains that you eat each day are whole grains.  Eat breads and cereals that are made with whole-grain flour instead of refined flour or white flour.  Eat brown rice, bulgur wheat, or millet instead of white rice.  Start the day with a breakfast that is high in fiber, such as a cereal that contains 5 g of fiber or more per serving.  Use beans in place of meat in soups, salads, and pasta dishes.  Eat high-fiber snacks, such as berries, raw vegetables, nuts, and popcorn.  Choose whole fruits and vegetables instead of processed forms like juice or sauce. What foods can I eat?  Fruits Berries. Pears. Apples. Oranges. Avocado. Prunes and raisins. Dried figs. Vegetables Sweet potatoes. Spinach. Kale. Artichokes. Cabbage. Broccoli. Cauliflower. Green peas. Carrots. Squash. Grains Whole-grain breads. Multigrain cereal. Oats and oatmeal. Brown rice. Barley. Bulgur wheat. [place]. Quinoa. Bran muffins. Popcorn. Rye wafer crackers. Meats and other proteins Navy, kidney, and pinto beans. Soybeans. Split peas. Lentils. Nuts and seeds. Dairy Fiber-fortified yogurt. Beverages Fiber-fortified soy milk. Fiber-fortified orange juice. Other foods Fiber bars. The items listed above may not be a complete list of recommended foods and beverages. Contact a dietitian for more options. What foods are not recommended? Fruits  Fruit juice. Cooked, strained fruit. Vegetables Fried potatoes. Canned vegetables. Well-cooked vegetables. Grains White bread. Pasta made with refined flour. White rice. Meats and other proteins Fatty cuts of meat. Fried chicken or fried fish. Dairy Milk. Yogurt. Cream cheese. Sour cream. Fats and  oils Butters. Beverages Soft drinks. Other foods Cakes and pastries. The items listed above may not be a complete list of foods and beverages to avoid. Contact a dietitian for more information. Summary  Fiber is a type of carbohydrate. It is found in fruits, vegetables, whole grains, and beans.  There are many health benefits of eating a high-fiber diet, such as preventing constipation, lowering blood cholesterol, helping with weight loss, and reducing your risk of heart disease, diabetes, and certain cancers.  Gradually increase your intake of fiber. Increasing too fast can result in cramping, bloating, and gas. Drink plenty of water while you increase your fiber.  The best sources of fiber include whole fruits and vegetables, whole grains, nuts, seeds, and beans. This information is not intended to replace advice given to you by your health care provider. Make sure you discuss any questions you have with your health care provider. Document Revised: 11/10/2016 Document Reviewed: 11/10/2016 Elsevier Patient Education  2020 [employer].

## 2020-01-06 NOTE — Progress Notes (Signed)
Cephas Darby, MD [address]  [place]  [place], [place] [address]  Main: [phone]  Fax: [phone]    Gastroenterology Consultation  Referring Provider:     Glean Hess, MD Primary Care Physician:  Glean Hess, MD Primary Gastroenterologist:  Dr. Cephas Darby Reason for Consultation:     Chronic constipation, symptomatic hemorrhoids        HPI:   Deborah Mills is a 44 y.o. female referred by Dr. Army Melia, Jesse Sans, MD  for consultation & management of chronic constipation symptomatic hemorrhoids.  Patient reports that for last 21 years, which is since the delivery of her 2 children she has been experiencing irregular bowel habits, hard stools, pellets like associated with significant straining, and sometimes manual disimpaction.  She spends about 15 to 30 minutes on toilet, she cannot have a bowel movement without consumption of an organic tea.  She tried dietary fiber as well as soluble fiber which resulted in bloating and more gas.  She said she has tried several other over-the-counter stool softeners which did not help.  She is also concerned about hemorrhoidal symptoms including rectal pain, pressure and swelling, prolapse, itching, discomfort, occasional scant rectal bleeding.  She reports that her hemorrhoids prolapse with every bowel movement and sometimes hard to reduce manually.  She had a flareup of hemorrhoids few days ago, she has tried several over-the-counter hemorrhoidal creams, witch hazel as well as Tucks pads which provide temporary relief.  NSAIDs: None  Antiplts/Anticoagulants/Anti thrombotics: None  GI Procedures: None   History reviewed. No pertinent past medical history.  Past Surgical History:  Procedure Laterality Date  . GANGLION CYST EXCISION    . ROTATOR CUFF REPAIR    . Uterine ablation  2010    Current Outpatient Medications:  .  Adapalene-Benzoyl Peroxide (EPIDUO) 0.1-2.5 % gel, Apply 1 application topically at  bedtime., Disp: 45 g, Rfl: 5 .  escitalopram (LEXAPRO) 20 MG tablet, Take 20 mg by mouth daily., Disp: , Rfl:  .  spironolactone (ALDACTONE) 100 MG tablet, Take 1 tablet (100 mg total) by mouth daily., Disp: 90 tablet, Rfl: 1 .  valACYclovir (VALTREX) 1000 MG tablet, Take 1,000 mg by mouth daily., Disp: , Rfl:  .  magnesium citrate SOLN, Take 296 mLs (1 Bottle total) by mouth once for 1 dose., Disp: 195 mL, Rfl: 0 .  Na Sulfate-K Sulfate-Mg Sulf 17.5-3.13-1.6 GM/177ML SOLN, Take 354 mLs by mouth once for 1 dose., Disp: 354 mL, Rfl: 0 .  phentermine (ADIPEX-P) 37.5 MG tablet, Take 37.5 mg by mouth daily. (Patient not taking: Reported on 01/06/2020), Disp: , Rfl:  .  polyethylene glycol powder (GLYCOLAX/MIRALAX) 17 GM/SCOOP powder, Take 255 g by mouth once for 1 dose., Disp: 255 g, Rfl: 0   Family History  Problem Relation Age of Onset  . Hyperlipidemia Mother   . Diabetes Mother      Social History   Tobacco Use  . Smoking status: Never Smoker  . Smokeless tobacco: Never Used  Substance Use Topics  . Alcohol use: No    Alcohol/week: 0.0 standard drinks    Allergies as of 01/06/2020  . (No Known Allergies)    Review of Systems:    All systems reviewed and negative except where noted in HPI.   Physical Exam:  BP (!) 160/76 (BP Location: Left Arm, Patient Position: Sitting, Cuff Size: Normal)   Pulse 72   Temp 97.9 F (36.6 C) (Oral)   Ht 5\' 5"  (  1.651 m)   Wt 176 lb 6 oz (80 kg)   BMI 29.35 kg/m  No LMP recorded. Patient has had an ablation.  General:   Alert,  Well-developed, well-nourished, pleasant and cooperative in NAD Head:  Normocephalic and atraumatic. Eyes:  Sclera clear, no icterus.   Conjunctiva pink. Ears:  Normal auditory acuity. Nose:  No deformity, discharge, or lesions. Mouth:  No deformity or lesions,oropharynx pink & moist. Neck:  Supple; no masses or thyromegaly. Lungs:  Respirations even and unlabored.  Clear throughout to auscultation.   No wheezes,  crackles, or rhonchi. No acute distress. Heart:  Regular rate and rhythm; no murmurs, clicks, rubs, or gallops. Abdomen:  Normal bowel sounds. Soft, non-tender and non-distended without masses, hepatosplenomegaly or hernias noted.  No guarding or rebound tenderness.   Rectal: Perianal skin tags, nontender digital rectal exam, palpable hard stool in the rectal vault Msk:  Symmetrical without gross deformities. Good, equal movement & strength bilaterally. Pulses:  Normal pulses noted. Extremities:  No clubbing or edema.  No cyanosis. Neurologic:  Alert and oriented x3;  grossly normal neurologically. Skin:  Intact without significant lesions or rashes. No jaundice. Lymph Nodes:  No significant cervical adenopathy. Psych:  Alert and cooperative. Normal mood and affect.  Imaging Studies: No abdominal imaging  Assessment and Plan:   Deborah Mills is a 44 y.o. female with history of chronic constipation as well as grade 3 symptomatic external hemorrhoids  Chronic constipation Reiterated on high-fiber diet, adequate intake of water Recommend MiraLAX bowel prep for cleanout Trial of Linzess 290 MCG daily, samples provided Discussed about proper toilet hygiene  Symptomatic external hemorrhoids Discussed about controlling constipation followed by hemorrhoid ligation Explained risks and benefits of the procedure, information provided  Colon cancer screening Recommend screening colonoscopy with 2-day prep   Follow up in 6 weeks  Cephas Darby, MD

## 2020-01-09 ENCOUNTER — Other Ambulatory Visit: Payer: Self-pay

## 2020-01-09 ENCOUNTER — Encounter: Payer: Self-pay | Admitting: Gastroenterology

## 2020-01-10 ENCOUNTER — Other Ambulatory Visit
Admission: RE | Admit: 2020-01-10 | Discharge: 2020-01-10 | Disposition: A | Discharge status: Home / Self Care | Payer: No Typology Code available for payment source | Source: Ambulatory Visit | Attending: Gastroenterology | Admitting: Gastroenterology

## 2020-01-10 DIAGNOSIS — Z01812 Encounter for preprocedural laboratory examination: Secondary | ICD-10-CM | POA: Insufficient documentation

## 2020-01-10 DIAGNOSIS — Z20822 Contact with and (suspected) exposure to covid-19: Secondary | ICD-10-CM | POA: Diagnosis not present

## 2020-01-10 LAB — SARS CORONAVIRUS 2 (TAT 6-24 HRS): SARS Coronavirus 2: NEGATIVE

## 2020-01-10 MED ORDER — NA SULFATE-K SULFATE-MG SULF 17.5-3.13-1.6 GM/177ML PO SOLN: 354.0000 mL | Freq: Once | ORAL | 0 refills | Status: AC

## 2020-01-10 NOTE — Telephone Encounter (Signed)
Called and left a message for call back  

## 2020-01-10 NOTE — Telephone Encounter (Signed)
Patient called back and states the pharmacy only gave her Miralax she states she will got by the pharmacy and pick up the suprep today. Will call the pharmacy at [facility] to make sure they have the medication Was on hold for 20 minutes with out them answering the phone just resent the Suprep to the pharmacy.

## 2020-01-11 NOTE — Discharge Instructions (Signed)
General Anesthesia, Adult, Care After This sheet gives you information about how to care for yourself after your procedure. Your health care provider may also give you more specific instructions. If you have problems or questions, contact your health care provider. What can I expect after the procedure? After the procedure, the following side effects are common:  Pain or discomfort at the IV site.  Nausea.  Vomiting.  Sore throat.  Trouble concentrating.  Feeling cold or chills.  Weak or tired.  Sleepiness and fatigue.  Soreness and body aches. These side effects can affect parts of the body that were not involved in surgery. Follow these instructions at home:  For at least 24 hours after the procedure:  Have a responsible adult stay with you. It is important to have someone help care for you until you are awake and alert.  Rest as needed.  Do not: ? Participate in activities in which you could fall or become injured. ? Drive. ? Use heavy machinery. ? Drink alcohol. ? Take sleeping pills or medicines that cause drowsiness. ? Make important decisions or sign legal documents. ? Take care of children on your own. Eating and drinking  Follow any instructions from your health care provider about eating or drinking restrictions.  When you feel hungry, start by eating small amounts of foods that are soft and easy to digest (bland), such as toast. Gradually return to your regular diet.  Drink enough fluid to keep your urine pale yellow.  If you vomit, rehydrate by drinking water, juice, or clear broth. General instructions  If you have sleep apnea, surgery and certain medicines can increase your risk for breathing problems. Follow instructions from your health care provider about wearing your sleep device: ? Anytime you are sleeping, including during daytime naps. ? While taking prescription pain medicines, sleeping medicines, or medicines that make you drowsy.  Return to  your normal activities as told by your health care provider. Ask your health care provider what activities are safe for you.  Take over-the-counter and prescription medicines only as told by your health care provider.  If you smoke, do not smoke without supervision.  Keep all follow-up visits as told by your health care provider. This is important. Contact a health care provider if:  You have nausea or vomiting that does not get better with medicine.  You cannot eat or drink without vomiting.  You have pain that does not get better with medicine.  You are unable to pass urine.  You develop a skin rash.  You have a fever.  You have redness around your IV site that gets worse. Get help right away if:  You have difficulty breathing.  You have chest pain.  You have blood in your urine or stool, or you vomit blood. Summary  After the procedure, it is common to have a sore throat or nausea. It is also common to feel tired.  Have a responsible adult stay with you for the first 24 hours after general anesthesia. It is important to have someone help care for you until you are awake and alert.  When you feel hungry, start by eating small amounts of foods that are soft and easy to digest (bland), such as toast. Gradually return to your regular diet.  Drink enough fluid to keep your urine pale yellow.  Return to your normal activities as told by your health care provider. Ask your health care provider what activities are safe for you. This information is not   intended to replace advice given to you by your health care provider. Make sure you discuss any questions you have with your health care provider. Document Revised: 01/09/2017 Document Reviewed: 08/22/2016 Elsevier Patient Education  2020 [employer] Inc[employer]  

## 2020-01-12 ENCOUNTER — Ambulatory Visit
Admission: RE | Admit: 2020-01-12 | Discharge: 2020-01-12 | Disposition: A | Discharge status: Home / Self Care | Payer: No Typology Code available for payment source | Attending: Gastroenterology | Admitting: Gastroenterology

## 2020-01-12 ENCOUNTER — Ambulatory Visit: Payer: No Typology Code available for payment source | Admitting: Anesthesiology

## 2020-01-12 ENCOUNTER — Encounter: Payer: Self-pay | Admitting: Gastroenterology

## 2020-01-12 ENCOUNTER — Encounter
Admission: RE | Disposition: A | Discharge status: Home / Self Care | Payer: Self-pay | Source: Home / Self Care | Attending: Gastroenterology

## 2020-01-12 ENCOUNTER — Other Ambulatory Visit: Payer: Self-pay

## 2020-01-12 DIAGNOSIS — K644 Residual hemorrhoidal skin tags: Secondary | ICD-10-CM | POA: Diagnosis not present

## 2020-01-12 DIAGNOSIS — Z1211 Encounter for screening for malignant neoplasm of colon: Secondary | ICD-10-CM | POA: Diagnosis present

## 2020-01-12 DIAGNOSIS — Z79899 Other long term (current) drug therapy: Secondary | ICD-10-CM | POA: Diagnosis not present

## 2020-01-12 HISTORY — DX: Other specified postprocedural states: R11.2

## 2020-01-12 HISTORY — DX: Unspecified hemorrhoids: K64.9

## 2020-01-12 HISTORY — DX: Other specified postprocedural states: Z98.890

## 2020-01-12 LAB — POCT PREGNANCY, URINE: Preg Test, Ur: NEGATIVE

## 2020-01-12 SURGERY — COLONOSCOPY WITH PROPOFOL
Anesthesia: General | Site: Rectum

## 2020-01-12 MED ORDER — LACTATED RINGERS IV SOLN: INTRAVENOUS | Status: DC

## 2020-01-12 MED ORDER — LACTATED RINGERS IV SOLN: INTRAVENOUS | Status: DC | PRN

## 2020-01-12 MED ORDER — STERILE WATER FOR IRRIGATION IR SOLN
Status: DC | PRN
  Administered 2020-01-12: 200 mL

## 2020-01-12 MED ORDER — SODIUM CHLORIDE 0.9 % IV SOLN: INTRAVENOUS | Status: DC

## 2020-01-12 MED ORDER — LIDOCAINE HCL (CARDIAC) PF 100 MG/5ML IV SOSY
PREFILLED_SYRINGE | INTRAVENOUS | Status: DC | PRN
  Administered 2020-01-12: 50 mg via INTRAVENOUS

## 2020-01-12 MED ORDER — PROPOFOL 10 MG/ML IV BOLUS
INTRAVENOUS | Status: DC | PRN
  Administered 2020-01-12 (×2): 20 mg via INTRAVENOUS
  Administered 2020-01-12: 40 mg via INTRAVENOUS
  Administered 2020-01-12: 20 mg via INTRAVENOUS
  Administered 2020-01-12: 30 mg via INTRAVENOUS
  Administered 2020-01-12: 20 mg via INTRAVENOUS
  Administered 2020-01-12 (×2): 30 mg via INTRAVENOUS
  Administered 2020-01-12: 20 mg via INTRAVENOUS
  Administered 2020-01-12: 80 mg via INTRAVENOUS
  Administered 2020-01-12: 30 mg via INTRAVENOUS

## 2020-01-12 SURGICAL SUPPLY — 26 items
CLIP HMST 235XBRD CATH ROT (MISCELLANEOUS) IMPLANT
CLIP RESOLUTION 360 11X235 (MISCELLANEOUS)
ELECT REM PT RETURN 9FT ADLT (ELECTROSURGICAL)
ELECTRODE REM PT RTRN 9FT ADLT (ELECTROSURGICAL) IMPLANT
FCP ESCP3.2XJMB 240X2.8X (MISCELLANEOUS)
FORCEPS BIOP RAD 4 LRG CAP 4 (CUTTING FORCEPS) IMPLANT
FORCEPS BIOP RJ4 240 W/NDL (MISCELLANEOUS)
FORCEPS ESCP3.2XJMB 240X2.8X (MISCELLANEOUS) IMPLANT
GOWN CVR UNV OPN BCK APRN NK (MISCELLANEOUS) ×2 IMPLANT
GOWN ISOL THUMB LOOP REG UNIV (MISCELLANEOUS) ×4
INJECTOR VARIJECT VIN23 (MISCELLANEOUS) IMPLANT
KIT DEFENDO VALVE AND CONN (KITS) IMPLANT
KIT PRC NS LF DISP ENDO (KITS) ×1 IMPLANT
KIT PROCEDURE OLYMPUS (KITS) ×2
MANIFOLD NEPTUNE II (INSTRUMENTS) ×2 IMPLANT
MARKER SPOT ENDO TATTOO 5ML (MISCELLANEOUS) IMPLANT
PROBE APC STR FIRE (PROBE) IMPLANT
RETRIEVER NET ROTH 2.5X230 LF (MISCELLANEOUS) IMPLANT
SNARE COLD EXACTO (MISCELLANEOUS) IMPLANT
SNARE SHORT THROW 13M SML OVAL (MISCELLANEOUS) IMPLANT
SNARE SHORT THROW 30M LRG OVAL (MISCELLANEOUS) IMPLANT
SNARE SNG USE RND 15MM (INSTRUMENTS) IMPLANT
SPOT EX ENDOSCOPIC TATTOO (MISCELLANEOUS)
TRAP ETRAP POLY (MISCELLANEOUS) IMPLANT
VARIJECT INJECTOR VIN23 (MISCELLANEOUS)
WATER STERILE IRR 250ML POUR (IV SOLUTION) ×2 IMPLANT

## 2020-01-12 NOTE — Transfer of Care (Signed)
Immediate Anesthesia Transfer of Care Note  Patient: Deborah Mills  Procedure(s) Performed: COLONOSCOPY WITH PROPOFOL (N/A Rectum)  Patient Location: PACU  Anesthesia Type: General  Level of Consciousness: awake, alert  and patient cooperative  Airway and Oxygen Therapy: Patient Spontanous Breathing and Patient connected to supplemental oxygen  Post-op Assessment: Post-op Vital signs reviewed, Patient's Cardiovascular Status Stable, Respiratory Function Stable, Patent Airway and No signs of Nausea or vomiting  Post-op Vital Signs: Reviewed and stable  Complications: No complications documented.

## 2020-01-12 NOTE — Anesthesia Postprocedure Evaluation (Signed)
Anesthesia Post Note  Patient: Deborah Mills  Procedure(s) Performed: COLONOSCOPY WITH PROPOFOL (N/A Rectum)     Patient location during evaluation: PACU Anesthesia Type: General Level of consciousness: awake and alert Pain management: pain level controlled Vital Signs Assessment: post-procedure vital signs reviewed and stable Respiratory status: spontaneous breathing Cardiovascular status: stable Anesthetic complications: no   No complications documented.  Gillian Scarce

## 2020-01-12 NOTE — Anesthesia Procedure Notes (Signed)
Performed by: Niall Illes, CRNA Pre-anesthesia Checklist: Patient identified, Emergency Drugs available, Suction available, Timeout performed and Patient being monitored Patient Re-evaluated:Patient Re-evaluated prior to induction Oxygen Delivery Method: Nasal cannula Placement Confirmation: positive ETCO2       

## 2020-01-12 NOTE — Anesthesia Preprocedure Evaluation (Addendum)
Anesthesia Evaluation  Patient identified by MRN, date of birth, ID band Patient awake    Reviewed: Allergy & Precautions, H&P , NPO status , Patient's Chart, lab work & pertinent test results  Airway Mallampati: I  TM Distance: >3 FB Neck ROM: full    Dental no notable dental hx.    Pulmonary neg pulmonary ROS,    Pulmonary exam normal        Cardiovascular negative cardio ROS Normal cardiovascular exam Rhythm:regular Rate:Normal     Neuro/Psych  Headaches, Anxiety    GI/Hepatic negative GI ROS, Neg liver ROS,   Endo/Other  negative endocrine ROS  Renal/GU negative Renal ROS     Musculoskeletal   Abdominal   Peds  Hematology negative hematology ROS (+)   Anesthesia Other Findings   Reproductive/Obstetrics                            Anesthesia Physical Anesthesia Plan  ASA: II  Anesthesia Plan: General   Post-op Pain Management:    Induction:   PONV Risk Score and Plan: 3 and Propofol infusion and TIVA  Airway Management Planned:   Additional Equipment:   Intra-op Plan:   Post-operative Plan:   Informed Consent: I have reviewed the patients History and Physical, chart, labs and discussed the procedure including the risks, benefits and alternatives for the proposed anesthesia with the patient or authorized representative who has indicated his/her understanding and acceptance.       Plan Discussed with:   Anesthesia Plan Comments:         Anesthesia Quick Evaluation

## 2020-01-12 NOTE — H&P (Signed)
Cephas Darby, MD [address]  [place]  [place], [place] [address]  Main: [phone]  Fax: [phone] Pager: [phone]  Primary Care Physician:  Glean Hess, MD Primary Gastroenterologist:  Dr. Cephas Darby  Pre-Procedure History & Physical: HPI:  Deborah Mills is a 44 y.o. female is here for an colonoscopy.   Past Medical History:  Diagnosis Date  . Hemorrhoids   . PONV (postoperative nausea and vomiting)    also, slow to wake    Past Surgical History:  Procedure Laterality Date  . GANGLION CYST EXCISION    . ROTATOR CUFF REPAIR    . Uterine ablation  2010    Prior to Admission medications   Medication Sig Start Date End Date Taking? Authorizing Provider  Cyanocobalamin (VITAMIN B12 PO) Take by mouth daily.   Yes [provider]  escitalopram (LEXAPRO) 20 MG tablet Take 20 mg by mouth daily.   Yes [provider]  Omega-3 Fatty Acids (FISH OIL PO) Take by mouth daily.   Yes [provider]  Pyridoxine HCl (VITAMIN B6 PO) Take by mouth daily.   Yes [provider]  spironolactone (ALDACTONE) 100 MG tablet Take 1 tablet (100 mg total) by mouth daily. 08/08/19  Yes Brendolyn Patty, MD  valACYclovir (VALTREX) 1000 MG tablet Take 1,000 mg by mouth daily. 08/25/19  Yes [provider]  Adapalene-Benzoyl Peroxide (EPIDUO) 0.1-2.5 % gel Apply 1 application topically at bedtime. 08/08/19   Brendolyn Patty, MD  phentermine (ADIPEX-P) 37.5 MG tablet Take 37.5 mg by mouth daily. Patient not taking: No sig reported 09/27/19   [provider]    Allergies as of 01/06/2020  . (No Known Allergies)    Family History  Problem Relation Age of Onset  . Hyperlipidemia Mother   . Diabetes Mother     Social History   Socioeconomic History  . Marital status: Married    Spouse name: Not on file  . Number of children: Not on file  . Years of education: Not on file  . Highest education level: Not on file   Occupational History  . Not on file  Tobacco Use  . Smoking status: Never Smoker  . Smokeless tobacco: Never Used  Vaping Use  . Vaping Use: Never used  Substance and Sexual Activity  . Alcohol use: No    Alcohol/week: 0.0 standard drinks  . Drug use: Not on file  . Sexual activity: Not on file  Other Topics Concern  . Not on file  Social History Narrative  . Not on file   Social Determinants of Health   Financial Resource Strain: Not on file  Food Insecurity: Not on file  Transportation Needs: Not on file  Physical Activity: Not on file  Stress: Not on file  Social Connections: Not on file  Intimate Partner Violence: Not on file    Review of Systems: See HPI, otherwise negative ROS  Physical Exam: BP 118/83   Pulse 70   Temp 97.6 F (36.4 C) (Temporal)   Ht 5\' 5"  (1.651 m)   Wt 77.1 kg   SpO2 96%   BMI 28.29 kg/m  General:   Alert,  pleasant and cooperative in NAD Head:  Normocephalic and atraumatic. Neck:  Supple; no masses or thyromegaly. Lungs:  Clear throughout to auscultation.    Heart:  Regular rate and rhythm. Abdomen:  Soft, nontender and nondistended. Normal bowel sounds, without guarding, and without rebound.   Neurologic:  Alert and  oriented x4;  grossly normal neurologically.  Impression/Plan: Deborah Mills is here for an colonoscopy to be performed for colon cancer screening  Risks, benefits, limitations, and alternatives regarding  colonoscopy have been reviewed with the patient.  Questions have been answered.  All parties agreeable.   Sherri Sear, MD  01/12/2020, 9:40 AM

## 2020-01-12 NOTE — Op Note (Signed)
[facility] Patient Name: Deborah Mills Procedure Date: 01/12/2020 10:53 AM MRN: [id number] Account #: [id number] Date of Birth: 02-Aug-1975 Admit Type: Outpatient Age: 44 Room: [facility] OR ROOM 01 Gender: Female Note Status: Finalized Procedure:             Colonoscopy Indications:           Screening for colorectal malignant neoplasm, This is                         the patient's first colonoscopy Providers:             Lin Landsman MD, MD Referring MD:          Halina Maidens, MD (Referring MD) Medicines:             General Anesthesia Complications:         No immediate complications. Estimated blood loss: None. Procedure:             Pre-Anesthesia Assessment:                        - Prior to the procedure, a History and Physical was                         performed, and patient medications and allergies were                         reviewed. The patient is competent. The risks and                         benefits of the procedure and the sedation options and                         risks were discussed with the patient. All questions                         were answered and informed consent was obtained.                         Patient identification and proposed procedure were                         verified by the physician, the nurse, the                         anesthesiologist, the anesthetist and the technician                         in the pre-procedure area in the procedure room in the                         endoscopy suite. Mental Status Examination: alert and                         oriented. Airway Examination: normal oropharyngeal                         airway and neck mobility. Respiratory Examination:  clear to auscultation. CV Examination: normal.                         Prophylactic Antibiotics: The patient does not require                         prophylactic antibiotics. Prior Anticoagulants:  The                         patient has taken no previous anticoagulant or                         antiplatelet agents. ASA Grade Assessment: II - A                         patient with mild systemic disease. After reviewing                         the risks and benefits, the patient was deemed in                         satisfactory condition to undergo the procedure. The                         anesthesia plan was to use general anesthesia.                         Immediately prior to administration of medications,                         the patient was re-assessed for adequacy to receive                         sedatives. The heart rate, respiratory rate, oxygen                         saturations, blood pressure, adequacy of pulmonary                         ventilation, and response to care were monitored                         throughout the procedure. The physical status of the                         patient was re-assessed after the procedure.                        After obtaining informed consent, the colonoscope was                         passed under direct vision. Throughout the procedure,                         the patient's blood pressure, pulse, and oxygen                         saturations were monitored continuously. The was  introduced through the anus and advanced to the the                         cecum, identified by appendiceal orifice and ileocecal                         valve. The colonoscopy was performed without                         difficulty. The patient tolerated the procedure well.                         The quality of the bowel preparation was evaluated                         using the BBPS [facility] Bowel Preparation Scale) with                         scores of: Right Colon = 3, Transverse Colon = 3 and                         Left Colon = 3 (entire mucosa seen well with no                         residual staining, small  fragments of stool or opaque                         liquid). The total BBPS score equals 9. Findings:      Skin tags were found on perianal exam.      The entire examined colon appeared normal.      Non-bleeding external hemorrhoids were found during retroflexion. The       hemorrhoids were small. Impression:            - Perianal skin tags found on perianal exam.                        - The entire examined colon is normal.                        - Non-bleeding external hemorrhoids.                        - No specimens collected. Recommendation:        - Discharge patient to home (with escort).                        - Resume previous diet today.                        - Continue present medications.                        - Repeat colonoscopy in 10 years for screening                         purposes. Procedure Code(s):     --- Professional ---  [id number], Colorectal cancer screening; colonoscopy on                         individual not meeting criteria for high risk Diagnosis Code(s):     --- Professional ---                        K64.4, Residual hemorrhoidal skin tags                        Z12.11, Encounter for screening for malignant neoplasm                         of colon CPT copyright 2019 American Medical Association. All rights reserved. The codes documented in this report are preliminary and upon coder review may  be revised to meet current compliance requirements. Dr. Ulyess Mort Lin Landsman MD, MD 01/12/2020 11:24:10 AM This report has been signed electronically. Number of Addenda: 0 Note Initiated On: 01/12/2020 10:53 AM Scope Withdrawal Time: 0 hours 8 minutes 42 seconds  Total Procedure Duration: 0 hours 14 minutes 4 seconds  Estimated Blood Loss:  Estimated blood loss: none.      [facility]

## 2020-01-16 ENCOUNTER — Encounter: Payer: Self-pay | Admitting: Internal Medicine

## 2020-02-01 ENCOUNTER — Encounter: Payer: Self-pay | Admitting: Gastroenterology

## 2020-02-01 MED ORDER — LINACLOTIDE 145 MCG PO CAPS: 145.0000 ug | ORAL_CAPSULE | Freq: Every day | ORAL | 0 refills | Status: DC

## 2020-02-02 ENCOUNTER — Telehealth: Payer: Self-pay

## 2020-02-02 NOTE — Telephone Encounter (Signed)
Medication was approved by patient insurance  

## 2020-02-02 NOTE — Telephone Encounter (Signed)
Did PA on Linzess 242mcg through cover my meds. Attached last office visit note

## 2020-02-03 ENCOUNTER — Encounter: Payer: Self-pay | Admitting: Internal Medicine

## 2020-02-03 ENCOUNTER — Other Ambulatory Visit: Payer: Self-pay

## 2020-02-03 ENCOUNTER — Ambulatory Visit (INDEPENDENT_AMBULATORY_CARE_PROVIDER_SITE_OTHER): Payer: BC Managed Care – PPO | Admitting: Internal Medicine

## 2020-02-03 VITALS — BP 124/84 | HR 72 | Temp 98.3°F | Ht 65.0 in | Wt 175.0 lb

## 2020-02-03 DIAGNOSIS — E785 Hyperlipidemia, unspecified: Secondary | ICD-10-CM

## 2020-02-03 DIAGNOSIS — Z0000 Encounter for general adult medical examination without abnormal findings: Secondary | ICD-10-CM

## 2020-02-03 NOTE — Progress Notes (Signed)
Date:  02/03/2020   Name:  Deborah Mills   DOB:  Aug 25, 1975   MRN:  [id number]   Chief Complaint: Annual Exam (Breast exam no pap / mamm scheduled for 3/21 and pap Dr. York Cerise in [place])  Deborah Mills is a 45 y.o. female who presents today for her Complete Annual Exam. She feels well. She reports exercising walking/gym X3-4 times a day. She reports she is sleeping well. Breast complaints none.  Mammogram: 01/2017 @ [facility] Pap smear: 01/2017 thin prep @ [facility] Colonoscopy: 12/2019  Immunization History  Administered Date(s) Administered  . Influenza-Unspecified 12/04/2017, 10/21/2018, 10/18/2019  . Moderna Sars-Covid-2 Vaccination 01/12/2019, 02/12/2019, 11/18/2019  . Tdap 11/03/2014    HPI  Lab Results  Component Value Date   CREATININE 0.81 02/14/2019   BUN 15 02/14/2019   NA 137 02/14/2019   K 4.0 02/14/2019   CL 104 02/14/2019   CO2 23 02/14/2019   Lab Results  Component Value Date   CHOL 177 02/14/2019   HDL 65 02/14/2019   LDLCALC 97 02/14/2019   TRIG 81 02/14/2019   CHOLHDL 2.7 02/14/2019   Lab Results  Component Value Date   TSH 0.857 02/14/2019   Lab Results  Component Value Date   HGBA1C 4.8 11/03/2017   Lab Results  Component Value Date   WBC 4.2 02/14/2019   HGB 14.3 02/14/2019   HCT 39.9 02/14/2019   MCV 88 02/14/2019   PLT 199 02/14/2019   Lab Results  Component Value Date   ALT 26 02/14/2019   AST 51 (H) 02/14/2019   ALKPHOS 46 02/14/2019   BILITOT 0.4 02/14/2019     Review of Systems  Constitutional: Negative for chills, fatigue and fever.  HENT: Negative for congestion, hearing loss, tinnitus, trouble swallowing and voice change.   Eyes: Negative for visual disturbance.  Respiratory: Negative for cough, chest tightness, shortness of breath and wheezing.   Cardiovascular: Negative for chest pain, palpitations and leg swelling.  Gastrointestinal: Positive for constipation. Negative for abdominal pain, diarrhea and  vomiting.  Endocrine: Negative for polydipsia and polyuria.  Genitourinary: Negative for dysuria, frequency, genital sores, vaginal bleeding and vaginal discharge.  Musculoskeletal: Negative for arthralgias, gait problem and joint swelling.  Skin: Negative for color change and rash.  Neurological: Negative for dizziness, tremors, light-headedness and headaches.  Hematological: Negative for adenopathy. Does not bruise/bleed easily.  Psychiatric/Behavioral: Negative for dysphoric mood and sleep disturbance. The patient is not nervous/anxious.     Patient Active Problem List   Diagnosis Date Noted  . Screening for colon cancer   . Other acne 01/27/2018  . Hyperlipidemia, mild 08/29/2015  . Anxiety disorder due to known physiological condition 10/27/2014  . Awareness of heartbeats 10/27/2014  . Degeneration of intervertebral disc of cervical region 10/27/2014  . Headache, migraine 10/27/2014    No Known Allergies  Past Surgical History:  Procedure Laterality Date  . COLONOSCOPY WITH PROPOFOL N/A 01/12/2020   Procedure: COLONOSCOPY WITH PROPOFOL;  Surgeon: Lin Landsman, MD;  Location: [facility];  Service: Endoscopy;  Laterality: N/A;  . GANGLION CYST EXCISION    . ROTATOR CUFF REPAIR    . Uterine ablation  2010    Social History   Tobacco Use  . Smoking status: Never Smoker  . Smokeless tobacco: Never Used  Vaping Use  . Vaping Use: Never used  Substance Use Topics  . Alcohol use: No    Alcohol/week: 0.0 standard drinks     Medication  list has been reviewed and updated.  Current Meds  Medication Sig  . Adapalene-Benzoyl Peroxide (EPIDUO) 0.1-2.5 % gel Apply 1 application topically at bedtime. (Patient taking differently: Apply 1 application topically as needed.)  . Cyanocobalamin (VITAMIN B12 PO) Take by mouth daily.  Marland Kitchen escitalopram (LEXAPRO) 20 MG tablet Take 20 mg by mouth daily.  Marland Kitchen linaclotide (LINZESS) 145 MCG CAPS capsule Take 1 capsule (145 mcg  total) by mouth daily before breakfast.  . Omega-3 Fatty Acids (FISH OIL PO) Take by mouth daily.  . Pyridoxine HCl (VITAMIN B6 PO) Take by mouth daily.  Marland Kitchen spironolactone (ALDACTONE) 100 MG tablet Take 1 tablet (100 mg total) by mouth daily.    PHQ 2/9 Scores 02/03/2020 02/01/2019 01/27/2018 08/29/2016  PHQ - 2 Score 0 0 0 0  PHQ- 9 Score 0 0 - -    GAD 7 : Generalized Anxiety Score 02/03/2020  Nervous, Anxious, on Edge 0  Control/stop worrying 0  Worry too much - different things 0  Trouble relaxing 0  Restless 0  Easily annoyed or irritable 0  Afraid - awful might happen 0  Total GAD 7 Score 0    BP Readings from Last 3 Encounters:  02/03/20 124/84  01/12/20 113/74  01/06/20 (!) 160/76    Physical Exam Vitals and nursing note reviewed.  Constitutional:      General: She is not in acute distress.    Appearance: She is well-developed.  HENT:     Head: Normocephalic and atraumatic.     Right Ear: Tympanic membrane and ear canal normal.     Left Ear: Tympanic membrane and ear canal normal.     Nose:     Right Sinus: No maxillary sinus tenderness.     Left Sinus: No maxillary sinus tenderness.  Eyes:     General: No scleral icterus.       Right eye: No discharge.        Left eye: No discharge.     Conjunctiva/sclera: Conjunctivae normal.  Neck:     Thyroid: No thyromegaly.     Vascular: No carotid bruit.  Cardiovascular:     Rate and Rhythm: Normal rate and regular rhythm.     Pulses: Normal pulses.     Heart sounds: Normal heart sounds.  Pulmonary:     Effort: Pulmonary effort is normal. No respiratory distress.     Breath sounds: Normal breath sounds. No wheezing or rhonchi.  Chest:  Breasts:     Right: No mass, nipple discharge, skin change or tenderness.     Left: No mass, nipple discharge, skin change or tenderness.    Abdominal:     General: Bowel sounds are normal.     Palpations: Abdomen is soft.     Tenderness: There is no abdominal tenderness.   Musculoskeletal:        General: Normal range of motion.     Cervical back: Normal range of motion. No erythema.     Right lower leg: No edema.     Left lower leg: No edema.  Lymphadenopathy:     Cervical: No cervical adenopathy.  Skin:    General: Skin is warm and dry.     Capillary Refill: Capillary refill takes less than 2 seconds.     Findings: No rash.  Neurological:     General: No focal deficit present.     Mental Status: She is alert and oriented to person, place, and time.     Cranial Nerves: No  cranial nerve deficit.     Sensory: No sensory deficit.     Deep Tendon Reflexes: Reflexes are normal and symmetric.  Psychiatric:        Attention and Perception: Attention normal.        Mood and Affect: Mood normal.        Behavior: Behavior normal.     Wt Readings from Last 3 Encounters:  02/03/20 175 lb (79.4 kg)  01/12/20 170 lb (77.1 kg)  01/06/20 176 lb 6 oz (80 kg)    BP 124/84   Pulse 72   Temp 98.3 F (36.8 C) (Oral)   Wt 175 lb (79.4 kg)   HC 65" (165.1 cm)   SpO2 99%   BMI 29.12 kg/m   Assessment and Plan: 1. Annual physical exam Normal exam Continue healthy diet; recommend regular exercise Will request Mammogram and pap from GYN - CBC with Differential/Platelet - Comprehensive metabolic panel - TSH  2. Hyperlipidemia, mild - Lipid panel   Partially dictated using Editor, commissioning. Any errors are unintentional.  Halina Maidens, MD [facility]  02/03/2020

## 2020-02-09 NOTE — Progress Notes (Signed)
Called pt to remind her to get her labs done. Pt stated she had an appt next week that she will get them done after her apt.  KP

## 2020-02-15 ENCOUNTER — Telehealth: Payer: Self-pay

## 2020-02-15 ENCOUNTER — Other Ambulatory Visit: Payer: Self-pay | Admitting: Dermatology

## 2020-02-15 DIAGNOSIS — L700 Acne vulgaris: Secondary | ICD-10-CM

## 2020-02-15 NOTE — Telephone Encounter (Signed)
Patient is calling because she states the person that was going to cover her to be off for Friday is our with COVID. She was around him at work but is having no symptoms. Patient wants to know if we can change her visit to a mychart visit instead of coming in the office

## 2020-02-15 NOTE — Telephone Encounter (Signed)
Change appointment to mychart visit and informed patient

## 2020-02-15 NOTE — Telephone Encounter (Signed)
Sure, we can  RV

## 2020-02-17 ENCOUNTER — Encounter: Payer: Self-pay | Admitting: Gastroenterology

## 2020-02-17 ENCOUNTER — Telehealth (INDEPENDENT_AMBULATORY_CARE_PROVIDER_SITE_OTHER): Payer: BC Managed Care – PPO | Admitting: Gastroenterology

## 2020-02-17 DIAGNOSIS — K5904 Chronic idiopathic constipation: Secondary | ICD-10-CM

## 2020-02-17 DIAGNOSIS — K642 Third degree hemorrhoids: Secondary | ICD-10-CM

## 2020-02-17 NOTE — Progress Notes (Signed)
Deborah Sear, MD [address]  [place], [place] [address]  Main: [phone]  Fax: [phone]    Gastroenterology Consultation Video Visit  Referring Provider:     Glean Hess, MD Primary Care Physician:  Deborah Hess, MD Primary Gastroenterologist:  Dr. Cephas Mills Reason for Consultation:     Chronic idiopathic constipation        HPI:   Deborah Mills is a 45 y.o. female referred by Dr. Army Melia, Jesse Sans, MD  for consultation & management of chronic idiopathic constipation  Virtual Visit Video Note  I connected with Deborah Mills on 02/17/20 at  8:30 AM EST by video and verified that I am speaking with the correct person using two identifiers.   I discussed the limitations, risks, security and privacy concerns of performing an evaluation and management service by video and the availability of in person appointments. I also discussed with the patient that there may be a patient responsible charge related to this service. The patient expressed understanding and agreed to proceed.  Location of the Patient: Work  Location of the provider: Office  Persons participating in the visit: Patient and provider only   History of Present Illness:   Ms. Deborah Mills has chronic idiopathic constipation as well as symptomatic hemorrhoids.  Initially, she tried Linzess 290 MCG which resulted in severe diarrhea, therefore decreased to 145 MCG which she is currently taking it.  However, she experiences few bouts of diarrhea on the current dose.  She also reports that her hemorrhoidal symptoms get worse when she has more bowel movements.  She is still interested in hemorrhoid ligation.  NSAIDs: None  Antiplts/Anticoagulants/Anti thrombotics: None  GI Procedures:  Colonoscopy 01/12/2020 - Perianal skin tags found on perianal exam. - The entire examined colon is normal. - Non-bleeding external hemorrhoids. - No specimens collected.  Past Medical  History:  Diagnosis Date  . Hemorrhoids   . PONV (postoperative nausea and vomiting)    also, slow to wake    Past Surgical History:  Procedure Laterality Date  . COLONOSCOPY WITH PROPOFOL N/A 01/12/2020   Procedure: COLONOSCOPY WITH PROPOFOL;  Surgeon: Lin Landsman, MD;  Location: [facility];  Service: Endoscopy;  Laterality: N/A;  . GANGLION CYST EXCISION    . ROTATOR CUFF REPAIR    . Uterine ablation  2010    Current Outpatient Medications:  .  Adapalene-Benzoyl Peroxide (EPIDUO) 0.1-2.5 % gel, Apply 1 application topically at bedtime. (Patient taking differently: Apply 1 application topically as needed.), Disp: 45 g, Rfl: 5 .  Cyanocobalamin (VITAMIN B12 PO), Take by mouth daily., Disp: , Rfl:  .  escitalopram (LEXAPRO) 20 MG tablet, Take 20 mg by mouth daily., Disp: , Rfl:  .  linaclotide (LINZESS) 145 MCG CAPS capsule, Take 1 capsule (145 mcg total) by mouth daily before breakfast., Disp: 90 capsule, Rfl: 0 .  Omega-3 Fatty Acids (FISH OIL PO), Take by mouth daily., Disp: , Rfl:  .  Pyridoxine HCl (VITAMIN B6 PO), Take by mouth daily., Disp: , Rfl:  .  spironolactone (ALDACTONE) 100 MG tablet, TAKE 1 TABLET BY MOUTH  DAILY, Disp: 90 tablet, Rfl: 2   Family History  Problem Relation Age of Onset  . Hyperlipidemia Mother   . Diabetes Mother      Social History   Tobacco Use  . Smoking status: Never Smoker  . Smokeless tobacco: Never Used  Vaping Use  . Vaping Use: Never used  Substance Use Topics  . Alcohol use: No    Alcohol/week: 0.0 standard drinks  . Drug use: Never    Allergies as of 02/17/2020  . (No Known Allergies)     Imaging Studies: None  Assessment and Plan:   [name] is a 45 y.o. female with chronic idiopathic constipation and symptomatic external hemorrhoids  Chronic idiopathic constipation Patient likes to continue Linzess 145 MCG daily for now Continue high-fiber diet  Symptomatic external hemorrhoids Patient  will schedule an office visit for hemorrhoid ligation   Follow Up Instructions:   I discussed the assessment and treatment plan with the patient. The patient was provided an opportunity to ask questions and all were answered. The patient agreed with the plan and demonstrated an understanding of the instructions.   The patient was advised to call back or seek an in-person evaluation if the symptoms worsen or if the condition fails to improve as anticipated.  I provided 12 minutes of face-to-face time during this encounter.   Follow up in 2 to 3 weeks   Deborah Darby, MD

## 2020-04-05 ENCOUNTER — Other Ambulatory Visit: Payer: Self-pay | Admitting: Gastroenterology

## 2020-04-09 DIAGNOSIS — Z113 Encounter for screening for infections with a predominantly sexual mode of transmission: Secondary | ICD-10-CM | POA: Diagnosis not present

## 2020-04-09 DIAGNOSIS — Z01419 Encounter for gynecological examination (general) (routine) without abnormal findings: Secondary | ICD-10-CM | POA: Diagnosis not present

## 2020-04-09 DIAGNOSIS — Z1329 Encounter for screening for other suspected endocrine disorder: Secondary | ICD-10-CM | POA: Diagnosis not present

## 2020-04-09 DIAGNOSIS — Z131 Encounter for screening for diabetes mellitus: Secondary | ICD-10-CM | POA: Diagnosis not present

## 2020-04-09 DIAGNOSIS — Z1231 Encounter for screening mammogram for malignant neoplasm of breast: Secondary | ICD-10-CM | POA: Diagnosis not present

## 2020-04-09 DIAGNOSIS — Z0000 Encounter for general adult medical examination without abnormal findings: Secondary | ICD-10-CM | POA: Diagnosis not present

## 2020-04-09 DIAGNOSIS — Z6829 Body mass index (BMI) 29.0-29.9, adult: Secondary | ICD-10-CM | POA: Diagnosis not present

## 2020-04-09 DIAGNOSIS — E785 Hyperlipidemia, unspecified: Secondary | ICD-10-CM | POA: Diagnosis not present

## 2020-04-09 DIAGNOSIS — Z13228 Encounter for screening for other metabolic disorders: Secondary | ICD-10-CM | POA: Diagnosis not present

## 2020-04-09 LAB — HM MAMMOGRAPHY

## 2020-04-09 LAB — HM PAP SMEAR: HM Pap smear: NEGATIVE

## 2020-04-09 LAB — VITAMIN D 25 HYDROXY (VIT D DEFICIENCY, FRACTURES): Vit D, 25-Hydroxy: 30

## 2020-04-10 LAB — CBC WITH DIFFERENTIAL/PLATELET
Basophils Absolute: 0.1 10*3/uL (ref 0.0–0.2)
Basos: 1 %
EOS (ABSOLUTE): 0.2 10*3/uL (ref 0.0–0.4)
Eos: 2 %
Hematocrit: 41.7 % (ref 34.0–46.6)
Hemoglobin: 14.8 g/dL (ref 11.1–15.9)
Immature Grans (Abs): 0 10*3/uL (ref 0.0–0.1)
Immature Granulocytes: 0 %
Lymphocytes Absolute: 1.5 10*3/uL (ref 0.7–3.1)
Lymphs: 23 %
MCH: 32.5 pg (ref 26.6–33.0)
MCHC: 35.5 g/dL (ref 31.5–35.7)
MCV: 91 fL (ref 79–97)
Monocytes Absolute: 0.5 10*3/uL (ref 0.1–0.9)
Monocytes: 7 %
Neutrophils Absolute: 4.6 10*3/uL (ref 1.4–7.0)
Neutrophils: 67 %
Platelets: 185 10*3/uL (ref 150–450)
RBC: 4.56 x10E6/uL (ref 3.77–5.28)
RDW: 11.8 % (ref 11.7–15.4)
WBC: 6.8 10*3/uL (ref 3.4–10.8)

## 2020-04-10 LAB — COMPREHENSIVE METABOLIC PANEL
ALT: 17 IU/L (ref 0–32)
AST: 26 IU/L (ref 0–40)
Albumin/Globulin Ratio: 1.9 (ref 1.2–2.2)
Albumin: 4.3 g/dL (ref 3.8–4.8)
Alkaline Phosphatase: 52 IU/L (ref 44–121)
BUN/Creatinine Ratio: 21 (ref 9–23)
BUN: 19 mg/dL (ref 6–24)
Bilirubin Total: 0.4 mg/dL (ref 0.0–1.2)
CO2: 21 mmol/L (ref 20–29)
Calcium: 9.7 mg/dL (ref 8.7–10.2)
Chloride: 102 mmol/L (ref 96–106)
Creatinine, Ser: 0.89 mg/dL (ref 0.57–1.00)
Globulin, Total: 2.3 g/dL (ref 1.5–4.5)
Glucose: 43 mg/dL — ABNORMAL LOW (ref 65–99)
Potassium: 4.2 mmol/L (ref 3.5–5.2)
Sodium: 138 mmol/L (ref 134–144)
Total Protein: 6.6 g/dL (ref 6.0–8.5)
eGFR: 81 mL/min/{1.73_m2} (ref >59)

## 2020-04-10 LAB — LIPID PANEL
Chol/HDL Ratio: 3.1 ratio (ref 0.0–4.4)
Cholesterol, Total: 160 mg/dL (ref 100–199)
HDL: 52 mg/dL (ref >39)
LDL Chol Calc (NIH): 94 mg/dL (ref 0–99)
Triglycerides: 75 mg/dL (ref 0–149)
VLDL Cholesterol Cal: 14 mg/dL (ref 5–40)

## 2020-04-10 LAB — TSH: TSH: 0.985 u[IU]/mL (ref 0.450–4.500)

## 2020-04-20 ENCOUNTER — Encounter: Payer: Self-pay | Admitting: Internal Medicine

## 2020-04-21 ENCOUNTER — Encounter: Payer: Self-pay | Admitting: Internal Medicine

## 2020-05-08 ENCOUNTER — Encounter: Payer: Self-pay | Admitting: Family Medicine

## 2020-05-08 ENCOUNTER — Other Ambulatory Visit: Payer: Self-pay | Admitting: Family Medicine

## 2020-05-08 DIAGNOSIS — Z113 Encounter for screening for infections with a predominantly sexual mode of transmission: Secondary | ICD-10-CM

## 2020-05-08 DIAGNOSIS — B9689 Other specified bacterial agents as the cause of diseases classified elsewhere: Secondary | ICD-10-CM

## 2020-05-08 DIAGNOSIS — N760 Acute vaginitis: Secondary | ICD-10-CM

## 2020-05-08 LAB — WET PREP FOR TRICH, YEAST, CLUE
Trichomonas Exam: NEGATIVE
Yeast Exam: NEGATIVE

## 2020-05-08 MED ORDER — METRONIDAZOLE 500 MG PO TABS: 500.0000 mg | ORAL_TABLET | Freq: Two times a day (BID) | ORAL | 0 refills | Status: AC

## 2020-05-08 NOTE — Progress Notes (Signed)
S: Patient in clinic today with complaints of vaginal irritation, that started 2-3 days ago.    O: wet prep results- negative   A: Vaginal exam performed today, thick white discharge noted in vaginal canal.    P:   1. Screening examination for venereal disease        2. BV (bacterial vaginosis)  Patient treated for BV per provider assessment.  Patient treated with Metronidazole 500 mg PO BID x 7 days.   Patient given instructions for Medications.  Patient provided instructions for prevention of BV .  Patient verbalizes understanding.   Patient to return to clinic if symptoms worsen.  - WET PREP FOR TRICH, YEAST, CLUE  - metroNIDAZOLE (FLAGYL) 500 MG tablet; Take 1 tablet (500 mg total) by mouth 2 (two) times daily for 7 days.  Dispense: 14 tablet; Refill: 0   Junious Dresser, FNP

## 2020-08-20 ENCOUNTER — Encounter: Payer: No Typology Code available for payment source | Admitting: Dermatology

## 2020-08-22 ENCOUNTER — Encounter: Payer: Self-pay | Admitting: Dermatology

## 2020-08-22 ENCOUNTER — Ambulatory Visit: Payer: BC Managed Care – PPO | Admitting: Dermatology

## 2020-08-22 ENCOUNTER — Other Ambulatory Visit: Payer: Self-pay

## 2020-08-22 DIAGNOSIS — Z1283 Encounter for screening for malignant neoplasm of skin: Secondary | ICD-10-CM | POA: Diagnosis not present

## 2020-08-22 DIAGNOSIS — L578 Other skin changes due to chronic exposure to nonionizing radiation: Secondary | ICD-10-CM

## 2020-08-22 DIAGNOSIS — I781 Nevus, non-neoplastic: Secondary | ICD-10-CM | POA: Diagnosis not present

## 2020-08-22 DIAGNOSIS — D235 Other benign neoplasm of skin of trunk: Secondary | ICD-10-CM

## 2020-08-22 DIAGNOSIS — D229 Melanocytic nevi, unspecified: Secondary | ICD-10-CM

## 2020-08-22 DIAGNOSIS — D1800 Hemangioma unspecified site: Secondary | ICD-10-CM

## 2020-08-22 DIAGNOSIS — L700 Acne vulgaris: Secondary | ICD-10-CM

## 2020-08-22 DIAGNOSIS — L821 Other seborrheic keratosis: Secondary | ICD-10-CM

## 2020-08-22 DIAGNOSIS — L814 Other melanin hyperpigmentation: Secondary | ICD-10-CM

## 2020-08-22 DIAGNOSIS — D225 Melanocytic nevi of trunk: Secondary | ICD-10-CM

## 2020-08-22 MED ORDER — SPIRONOLACTONE 100 MG PO TABS: 100.0000 mg | ORAL_TABLET | Freq: Every day | ORAL | 3 refills | Status: DC

## 2020-08-22 MED ORDER — ADAPALENE-BENZOYL PEROXIDE 0.1-2.5 % EX GEL: 1.0000 "application " | Freq: Every day | CUTANEOUS | 5 refills | Status: DC

## 2020-08-22 NOTE — Progress Notes (Signed)
Follow-Up Visit   Subjective  Deborah Mills is a 45 y.o. female who presents for the following: Annual Exam (Patient presents for TBSE. She has a history of dysplastic nevus of the left lower leg excised in the past. Acne is controlled with Spironolactone '100mg'$  daily and Adapalene-Benzoyl Peroxide gel prn. No side effects from oral med.).   The following portions of the chart were reviewed this encounter and updated as appropriate:       Review of Systems:  No other skin or systemic complaints except as noted in HPI or Assessment and Plan.  Objective  Well appearing patient in no apparent distress; mood and affect are within normal limits.  A full examination was performed including scalp, head, eyes, ears, nose, lips, neck, chest, axillae, abdomen, back, buttocks, bilateral upper extremities, bilateral lower extremities, hands, feet, fingers, toes, fingernails, and toenails. All findings within normal limits unless otherwise noted below.  Left Upper Flank, left upper breast Left Upper Flank: 6 x 71m speckled brown papule   Left Upper Breast: 458mmedium dark brown macule with notch- present for years, no changes per pt  Right Super Pubic: 6 x 3 mm two-tone brown macule       nasal tip Nasal tip: 32m59mlanching pink macule    face Cystic papule on the right chin. BP 124/84   Assessment & Plan  Skin cancer screening performed today.  Actinic Damage - chronic, secondary to cumulative UV radiation exposure/sun exposure over time - diffuse scaly erythematous macules with underlying dyspigmentation - Recommend daily broad spectrum sunscreen SPF 30+ to sun-exposed areas, reapply every 2 hours as needed.  - Recommend staying in the shade or wearing long sleeves, sun glasses (UVA+UVB protection) and wide brim hats (4-inch brim around the entire circumference of the hat). - Call for new or changing lesions.  Lentigines - Scattered tan macules - Due to sun exposure -  Benign-appering, observe - Recommend daily broad spectrum sunscreen SPF 30+ to sun-exposed areas, reapply every 2 hours as needed. - Call for any changes  Hemangiomas - Red papules - Discussed benign nature - Observe - Call for any changes  Seborrheic Keratoses - Stuck-on, waxy, tan-brown papules and/or plaques  - Benign-appearing - Discussed benign etiology and prognosis. - Observe - Call for any changes  Dermatofibroma - Firm pink/brown papulenodule with dimple sign of the left upper back - Benign appearing - Call for any changes  Melanocytic Nevi - Tan-brown and/or pink-flesh-colored symmetric macules and papules - Benign appearing on exam today - Observation - Call clinic for new or changing moles - Recommend daily use of broad spectrum spf 30+ sunscreen to sun-exposed areas.    Nevus (2) left upper breast; Left Upper Flank  Benign-appearing.  Observation.  Call clinic for new or changing moles.  Recommend daily use of broad spectrum spf 30+ sunscreen to sun-exposed areas.   Telangiectasias nasal tip  Benign, observe.    Acne vulgaris face  Chronic cystic acne with small flare today, usually well-controlled on treatment.  Continue Spironolactone '100mg'$  take 1 po QD dsp #90 3Rf  Continue generic Epiduo Gel QD prn dsp 45g 5Rf  Spironolactone can cause increased urination and cause blood pressure to decrease. Please watch for signs of lightheadedness and be cautious when changing position. It can sometimes cause breast tenderness or an irregular period in premenopausal women. It can also increase potassium. The increase in potassium usually is not a concern unless you are taking other medicines that also increase  potassium, so please be sure your doctor knows all of the other medications you are taking. This medication should not be taken by pregnant women.  This medicine should also not be taken together with sulfa drugs like Bactrim (trimethoprim/sulfamethexazole).    Topical retinoid medications like tretinoin/Retin-A, adapalene/Differin, tazarotene/Fabior, and Epiduo/Epiduo Forte can cause dryness and irritation when first started. Only apply a pea-sized amount to the entire affected area. Avoid applying it around the eyes, edges of mouth and creases at the nose. If you experience irritation, use a good moisturizer first and/or apply the medicine less often. If you are doing well with the medicine, you can increase how often you use it until you are applying every night. Be careful with sun protection while using this medication as it can make you sensitive to the sun. This medicine should not be used by pregnant women.   Benzoyl peroxide can cause dryness and irritation of the skin. It can also bleach fabric. When used together with Aczone (dapsone) cream, it can stain the skin orange.   Related Medications spironolactone (ALDACTONE) 100 MG tablet Take 1 tablet (100 mg total) by mouth daily.  Adapalene-Benzoyl Peroxide (EPIDUO) 0.1-2.5 % gel Apply 1 application topically at bedtime.  Return in about 1 year (around 08/22/2021) for TBSE.  Documentation: I have reviewed the above documentation for accuracy and completeness, and I agree with the above.  Brendolyn Patty MD

## 2020-08-22 NOTE — Patient Instructions (Addendum)
Spironolactone can cause increased urination and cause blood pressure to decrease. Please watch for signs of lightheadedness and be cautious when changing position. It can sometimes cause breast tenderness or an irregular period in premenopausal women. It can also increase potassium. The increase in potassium usually is not a concern unless you are taking other medicines that also increase potassium, so please be sure your doctor knows all of the other medications you are taking. This medication should not be taken by pregnant women.  This medicine should also not be taken together with sulfa drugs like Bactrim (trimethoprim/sulfamethexazole).   Topical retinoid medications like tretinoin/Retin-A, adapalene/Differin, tazarotene/Fabior, and Epiduo/Epiduo Forte can cause dryness and irritation when first started. Only apply a pea-sized amount to the entire affected area. Avoid applying it around the eyes, edges of mouth and creases at the nose. If you experience irritation, use a good moisturizer first and/or apply the medicine less often. If you are doing well with the medicine, you can increase how often you use it until you are applying every night. Be careful with sun protection while using this medication as it can make you sensitive to the sun. This medicine should not be used by pregnant women.   Benzoyl peroxide can cause dryness and irritation of the skin. It can also bleach fabric. When used together with Aczone (dapsone) cream, it can stain the skin orange.  Melanoma ABCDEs  Melanoma is the most dangerous type of skin cancer, and is the leading cause of death from skin disease.  You are more likely to develop melanoma if you: Have light-colored skin, light-colored eyes, or red or blond hair Spend a lot of time in the sun Tan regularly, either outdoors or in a tanning bed Have had blistering sunburns, especially during childhood Have a close family member who has had a melanoma Have atypical  moles or large birthmarks  Early detection of melanoma is key since treatment is typically straightforward and cure rates are extremely high if we catch it early.   The first sign of melanoma is often a change in a mole or a new dark spot.  The ABCDE system is a way of remembering the signs of melanoma.  A for asymmetry:  The two halves do not match. B for border:  The edges of the growth are irregular. C for color:  A mixture of colors are present instead of an even brown color. D for diameter:  Melanomas are usually (but not always) greater than 27m - the size of a pencil eraser. E for evolution:  The spot keeps changing in size, shape, and color.  Please check your skin once per month between visits. You can use a small mirror in front and a large mirror behind you to keep an eye on the back side or your body.   If you see any new or changing lesions before your next follow-up, please call to schedule a visit.  Please continue daily skin protection including broad spectrum sunscreen SPF 30+ to sun-exposed areas, reapplying every 2 hours as needed when you're outdoors.   Staying in the shade or wearing long sleeves, sun glasses (UVA+UVB protection) and wide brim hats (4-inch brim around the entire circumference of the hat) are also recommended for sun protection.    If you have any questions or concerns for your doctor, please call our main line at [phone] press option 4 to reach your doctor's medical assistant. If no one answers, please leave a voicemail as directed and  we will return your call as soon as possible. Messages left after 4 pm will be answered the following business day.   You may also send [place] a message via [place]. We typically respond to MyChart messages within 1-2 business days.  For prescription refills, please ask your pharmacy to contact our office. Our fax number is [phone].  If you have an urgent issue when the clinic is closed that cannot wait until  the next business day, you can page your doctor at the number below.    Please note that while we do our best to be available for urgent issues outside of office hours, we are not available 24/7.   If you have an urgent issue and are unable to reach [place], you may choose to seek medical care at your doctor's office, retail clinic, urgent care center, or emergency room.  If you have a medical emergency, please immediately call 911 or go to the emergency department.  Pager Numbers  - Dr. Nehemiah Massed: [phone]  - Dr. Laurence Ferrari: [phone]  - Dr. Nicole Kindred: [phone]  In the event of inclement weather, please call our main line at [phone] for an update on the status of any delays or closures.  Dermatology Medication Tips: Please keep the boxes that topical medications come in in order to help keep track of the instructions about where and how to use these. Pharmacies typically print the medication instructions only on the boxes and not directly on the medication tubes.   If your medication is too expensive, please contact our office at [phone] option 4 or send [place] a message through [place].   We are unable to tell what your co-pay for medications will be in advance as this is different depending on your insurance coverage. However, we may be able to find a substitute medication at lower cost or fill out paperwork to get insurance to cover a needed medication.   If a prior authorization is required to get your medication covered by your insurance company, please allow [place] 1-2 business days to complete this process.  Drug prices often vary depending on where the prescription is filled and some pharmacies may offer cheaper prices.  The website www.goodrx.com contains coupons for medications through different pharmacies. The prices here do not account for what the cost may be with help from insurance (it may be cheaper with your insurance), but the website can give you the price if you did  not use any insurance.  - You can print the associated coupon and take it with your prescription to the pharmacy.  - You may also stop by our office during regular business hours and pick up a GoodRx coupon card.  - If you need your prescription sent electronically to a different pharmacy, notify our office through [facility] or by phone at [phone] option 4.

## 2021-01-03 ENCOUNTER — Other Ambulatory Visit: Payer: Self-pay

## 2021-01-03 ENCOUNTER — Other Ambulatory Visit: Payer: Self-pay | Admitting: Gastroenterology

## 2021-01-03 MED ORDER — LINACLOTIDE 145 MCG PO CAPS: ORAL_CAPSULE | ORAL | 0 refills | Status: AC

## 2021-01-09 ENCOUNTER — Ambulatory Visit: Payer: BC Managed Care – PPO | Admitting: Internal Medicine

## 2021-01-09 ENCOUNTER — Encounter: Payer: Self-pay | Admitting: Internal Medicine

## 2021-01-09 ENCOUNTER — Other Ambulatory Visit: Payer: Self-pay

## 2021-01-09 ENCOUNTER — Other Ambulatory Visit: Payer: Self-pay | Admitting: Internal Medicine

## 2021-01-09 VITALS — BP 118/78 | HR 78 | Ht 65.0 in | Wt 168.0 lb

## 2021-01-09 DIAGNOSIS — R300 Dysuria: Secondary | ICD-10-CM | POA: Diagnosis not present

## 2021-01-09 DIAGNOSIS — Z113 Encounter for screening for infections with a predominantly sexual mode of transmission: Secondary | ICD-10-CM | POA: Diagnosis not present

## 2021-01-09 DIAGNOSIS — B9689 Other specified bacterial agents as the cause of diseases classified elsewhere: Secondary | ICD-10-CM

## 2021-01-09 DIAGNOSIS — N760 Acute vaginitis: Secondary | ICD-10-CM

## 2021-01-09 LAB — POCT URINALYSIS DIPSTICK
Bilirubin, UA: NEGATIVE
Blood, UA: NEGATIVE
Glucose, UA: NEGATIVE
Ketones, UA: NEGATIVE
Leukocytes, UA: NEGATIVE
Nitrite, UA: NEGATIVE
Protein, UA: NEGATIVE
Spec Grav, UA: 1.01 (ref 1.010–1.025)
Urobilinogen, UA: 0.2 E.U./dL
pH, UA: 6.5 (ref 5.0–8.0)

## 2021-01-09 LAB — POCT WET PREP WITH KOH
KOH Prep POC: NEGATIVE
RBC Wet Prep HPF POC: 0
Trichomonas, UA: NEGATIVE
Yeast Wet Prep HPF POC: NEGATIVE

## 2021-01-09 MED ORDER — METRONIDAZOLE 500 MG PO TABS: 500.0000 mg | ORAL_TABLET | Freq: Two times a day (BID) | ORAL | 0 refills | Status: AC

## 2021-01-09 NOTE — Progress Notes (Signed)
Date:  01/09/2021   Name:  Deborah Mills   DOB:  1975/08/26   MRN:  [id number]   Chief Complaint: Vaginitis  Vaginal Discharge The patient's primary symptoms include genital itching and vaginal discharge. This is a new problem. The current episode started in the past 7 days. The problem has been unchanged. The pain is mild. The problem affects both sides. She is not pregnant. Associated symptoms include dysuria. Pertinent negatives include no chills, fever or headaches. The vaginal discharge was normal. There has been no bleeding.   Lab Results  Component Value Date   NA 138 04/09/2020   K 4.2 04/09/2020   CO2 21 04/09/2020   GLUCOSE 43 (L) 04/09/2020   BUN 19 04/09/2020   CREATININE 0.89 04/09/2020   CALCIUM 9.7 04/09/2020   EGFR 81 04/09/2020   GFRNONAA 88 02/14/2019   Lab Results  Component Value Date   CHOL 160 04/09/2020   HDL 52 04/09/2020   LDLCALC 94 04/09/2020   TRIG 75 04/09/2020   CHOLHDL 3.1 04/09/2020   Lab Results  Component Value Date   TSH 0.985 04/09/2020   Lab Results  Component Value Date   HGBA1C 4.8 11/03/2017   Lab Results  Component Value Date   WBC 6.8 04/09/2020   HGB 14.8 04/09/2020   HCT 41.7 04/09/2020   MCV 91 04/09/2020   PLT 185 04/09/2020   Lab Results  Component Value Date   ALT 17 04/09/2020   AST 26 04/09/2020   ALKPHOS 52 04/09/2020   BILITOT 0.4 04/09/2020   No results found for: 25OHVITD2, 25OHVITD3, VD25OH   Review of Systems  Constitutional:  Negative for chills, fatigue and fever.  Respiratory:  Negative for cough and shortness of breath.   Cardiovascular:  Negative for chest pain and leg swelling.  Genitourinary:  Positive for dysuria, vaginal discharge and vaginal pain. Negative for genital sores.  Neurological:  Negative for dizziness, light-headedness and headaches.   Patient Active Problem List   Diagnosis Date Noted   Other acne 01/27/2018   Hyperlipidemia, mild 08/29/2015   Anxiety disorder due to  known physiological condition 10/27/2014   Awareness of heartbeats 10/27/2014   Degeneration of intervertebral disc of cervical region 10/27/2014   Headache, migraine 10/27/2014    No Known Allergies  Past Surgical History:  Procedure Laterality Date   COLONOSCOPY WITH PROPOFOL N/A 01/12/2020   Procedure: COLONOSCOPY WITH PROPOFOL;  Surgeon: Lin Landsman, MD;  Location: [place];  Service: Endoscopy;  Laterality: N/A;   GANGLION CYST EXCISION     ROTATOR CUFF REPAIR     Uterine ablation  2010    Social History   Tobacco Use   Smoking status: Never   Smokeless tobacco: Never  Vaping Use   Vaping Use: Never used  Substance Use Topics   Alcohol use: No    Alcohol/week: 0.0 standard drinks   Drug use: Never     Medication list has been reviewed and updated.  Current Meds  Medication Sig   Adapalene-Benzoyl Peroxide (EPIDUO) 0.1-2.5 % gel Apply 1 application topically at bedtime.   Cyanocobalamin (VITAMIN B12 PO) Take by mouth daily.   escitalopram (LEXAPRO) 20 MG tablet Take 20 mg by mouth daily.   linaclotide (LINZESS) 145 MCG CAPS capsule TAKE 1 CAPSULE BY MOUTH  DAILY BEFORE BREAKFAST (Patient taking differently: as needed. TAKE 1 CAPSULE BY MOUTH  DAILY BEFORE BREAKFAST)   Omega-3 Fatty Acids (FISH OIL PO) Take by mouth daily.  Pyridoxine HCl (VITAMIN B6 PO) Take by mouth daily.   spironolactone (ALDACTONE) 100 MG tablet Take 1 tablet (100 mg total) by mouth daily.   valACYclovir (VALTREX) 1000 MG tablet Take 1,000 mg by mouth daily.    PHQ 2/9 Scores 01/09/2021 02/03/2020 02/01/2019 01/27/2018  PHQ - 2 Score 0 0 0 0  PHQ- 9 Score 1 0 0 -    GAD 7 : Generalized Anxiety Score 01/09/2021 02/03/2020  Nervous, Anxious, on Edge 0 0  Control/stop worrying 0 0  Worry too much - different things 1 0  Trouble relaxing 0 0  Restless 0 0  Easily annoyed or irritable 1 0  Afraid - awful might happen 0 0  Total GAD 7 Score 2 0    BP Readings from Last 3  Encounters:  01/09/21 118/78  02/03/20 124/84  01/12/20 113/74    Physical Exam Vitals and nursing note reviewed.  Constitutional:      General: She is not in acute distress.    Appearance: Normal appearance. She is well-developed.  HENT:     Head: Normocephalic and atraumatic.  Cardiovascular:     Rate and Rhythm: Normal rate and regular rhythm.     Pulses: Normal pulses.     Heart sounds: No murmur heard. Pulmonary:     Effort: Pulmonary effort is normal. No respiratory distress.     Breath sounds: No wheezing or rhonchi.  Skin:    General: Skin is warm and dry.     Findings: No rash.  Neurological:     Mental Status: She is alert and oriented to person, place, and time.  Psychiatric:        Mood and Affect: Mood normal.        Behavior: Behavior normal.    Wt Readings from Last 3 Encounters:  01/09/21 168 lb (76.2 kg)  02/03/20 175 lb (79.4 kg)  01/12/20 170 lb (77.1 kg)    BP 118/78    Pulse 78    Ht _0  (1.651 m)    Wt 168 lb (76.2 kg)    SpO2 100%    BMI 27.96 kg/m   Assessment and Plan: 1. BV (bacterial vaginosis) - POCT Wet Prep with KOH - metroNIDAZOLE (FLAGYL) 500 MG tablet; Take 1 tablet (500 mg total) by mouth 2 (two) times daily for 7 days.  Dispense: 14 tablet; Refill: 0  2. Dysuria UA negative but will get culture for clarification - POCT Urinalysis Dipstick - Urine Culture  3. Screening for STD (sexually transmitted disease) - GC/Chlamydia Probe Amp([facility])   Partially dictated using Editor, commissioning. Any errors are unintentional.  Halina Maidens, MD [facility]  01/09/2021

## 2021-01-12 LAB — URINE CULTURE

## 2021-01-13 LAB — GC/CHLAMYDIA PROBE AMP
Chlamydia trachomatis, NAA: NEGATIVE
Neisseria Gonorrhoeae by PCR: NEGATIVE

## 2021-02-18 ENCOUNTER — Encounter: Payer: Self-pay | Admitting: Internal Medicine

## 2021-02-27 ENCOUNTER — Telehealth: Payer: Self-pay

## 2021-02-27 ENCOUNTER — Encounter: Payer: Self-pay | Admitting: Internal Medicine

## 2021-02-27 NOTE — Telephone Encounter (Signed)
Copied from [place] [phone]. Topic: Appointment Scheduling - Scheduling Inquiry for Clinic >> Feb 27, 2021  9:06 AM Tessa Lerner A wrote: Reason for CRM: The patient would like to be contacted if there is a cancellation and they would potentially be able to be seen for their physical today 02/27/21  Please contact if possible   Left voice mail to have patient call back to confirm which appointment she wants to keep for her physical.

## 2021-02-28 ENCOUNTER — Encounter: Payer: BC Managed Care – PPO | Admitting: Internal Medicine

## 2021-04-16 DIAGNOSIS — Z6828 Body mass index (BMI) 28.0-28.9, adult: Secondary | ICD-10-CM | POA: Diagnosis not present

## 2021-04-16 DIAGNOSIS — Z124 Encounter for screening for malignant neoplasm of cervix: Secondary | ICD-10-CM | POA: Diagnosis not present

## 2021-04-16 DIAGNOSIS — Z01419 Encounter for gynecological examination (general) (routine) without abnormal findings: Secondary | ICD-10-CM | POA: Diagnosis not present

## 2021-04-16 DIAGNOSIS — Z1231 Encounter for screening mammogram for malignant neoplasm of breast: Secondary | ICD-10-CM | POA: Diagnosis not present

## 2021-04-16 LAB — HM MAMMOGRAPHY

## 2021-04-17 LAB — HM PAP SMEAR: HM Pap smear: NORMAL

## 2021-04-25 ENCOUNTER — Encounter: Payer: BC Managed Care – PPO | Admitting: Internal Medicine

## 2021-05-02 ENCOUNTER — Ambulatory Visit: Payer: BC Managed Care – PPO | Admitting: Internal Medicine

## 2021-05-02 ENCOUNTER — Encounter: Payer: Self-pay | Admitting: Internal Medicine

## 2021-05-02 VITALS — BP 126/84 | HR 72 | Ht 65.0 in | Wt 166.6 lb

## 2021-05-02 DIAGNOSIS — Z0000 Encounter for general adult medical examination without abnormal findings: Secondary | ICD-10-CM

## 2021-05-02 DIAGNOSIS — E785 Hyperlipidemia, unspecified: Secondary | ICD-10-CM | POA: Diagnosis not present

## 2021-05-02 DIAGNOSIS — F064 Anxiety disorder due to known physiological condition: Secondary | ICD-10-CM

## 2021-05-02 DIAGNOSIS — Z1231 Encounter for screening mammogram for malignant neoplasm of breast: Secondary | ICD-10-CM

## 2021-05-02 DIAGNOSIS — E559 Vitamin D deficiency, unspecified: Secondary | ICD-10-CM

## 2021-05-02 NOTE — Progress Notes (Signed)
? ? ?Date:  05/02/2021  ? ?Name:  Deborah Mills   DOB:  Jun 28, 1975   MRN:  [id number] ? ? ?Chief Complaint: Annual Exam (No breast no pap) ?Deborah Mills is a 46 y.o. female who presents today for her Complete Annual Exam. She feels well. She reports exercising gym 4-5 days a week. She reports she is sleeping well. Breast complaints none. ? ?Mammogram: 03/2021 ?DEXA: none ?Pap smear: 03/2021 ?Colonoscopy: 12/2019 ? ?There are no preventive care reminders to display for this patient. ?  ?Immunization History  ?Administered Date(s) Administered  ? Influenza-Unspecified 12/04/2017, 10/21/2018, 10/18/2019, 10/29/2020  ? Moderna Sars-Covid-2 Vaccination 01/12/2019, 02/12/2019, 11/18/2019  ? Tdap 11/03/2014  ? ? ?Anxiety ?Presents for follow-up visit. Patient reports no chest pain, dizziness, nervous/anxious behavior, palpitations or shortness of breath. Symptoms occur rarely.  ? ?Compliance with medications is 76-100% (lexapro).  ?Constipation ?This is a recurrent problem. The problem is unchanged. Her stool frequency is 2 to 3 times per week. The patient is on a high fiber diet. She Exercises regularly. There has Been adequate water intake. Pertinent negatives include no abdominal pain, diarrhea, fever or vomiting. Treatments tried: linzess. The treatment provided moderate (sometimes causes diarrhea so can not take daily) relief.  ? ?Lab Results  ?Component Value Date  ? NA 138 04/09/2020  ? K 4.2 04/09/2020  ? CO2 21 04/09/2020  ? GLUCOSE 43 (L) 04/09/2020  ? BUN 19 04/09/2020  ? CREATININE 0.89 04/09/2020  ? CALCIUM 9.7 04/09/2020  ? EGFR 81 04/09/2020  ? GFRNONAA 88 02/14/2019  ? ?Lab Results  ?Component Value Date  ? CHOL 160 04/09/2020  ? HDL 52 04/09/2020  ? [place] 94 04/09/2020  ? TRIG 75 04/09/2020  ? CHOLHDL 3.1 04/09/2020  ? ?Lab Results  ?Component Value Date  ? TSH 0.985 04/09/2020  ? ?Lab Results  ?Component Value Date  ? HGBA1C 4.8 11/03/2017  ? ?Lab Results  ?Component Value Date  ? WBC 6.8 04/09/2020   ? HGB 14.8 04/09/2020  ? HCT 41.7 04/09/2020  ? MCV 91 04/09/2020  ? PLT 185 04/09/2020  ? ?Lab Results  ?Component Value Date  ? ALT 17 04/09/2020  ? AST 26 04/09/2020  ? ALKPHOS 52 04/09/2020  ? BILITOT 0.4 04/09/2020  ? ?Lab Results  ?Component Value Date  ? VD25OH 30 04/09/2020  ?  ? ?Review of Systems  ?Constitutional:  Negative for chills, fatigue and fever.  ?HENT:  Negative for congestion, hearing loss, tinnitus, trouble swallowing and voice change.   ?Eyes:  Negative for visual disturbance.  ?Respiratory:  Negative for cough, chest tightness, shortness of breath and wheezing.   ?Cardiovascular:  Negative for chest pain, palpitations and leg swelling.  ?Gastrointestinal:  Positive for constipation. Negative for abdominal pain, diarrhea and vomiting.  ?Endocrine: Negative for polydipsia and polyuria.  ?Genitourinary:  Negative for dysuria, frequency, genital sores, vaginal bleeding and vaginal discharge.  ?Musculoskeletal:  Negative for arthralgias, gait problem and joint swelling.  ?Skin:  Negative for color change and rash.  ?Neurological:  Negative for dizziness, tremors, light-headedness and headaches.  ?Hematological:  Negative for adenopathy. Does not bruise/bleed easily.  ?Psychiatric/Behavioral:  Negative for dysphoric mood and sleep disturbance. The patient is not nervous/anxious.   ? ?Patient Active Problem List  ? Diagnosis Date Noted  ? Vitamin D deficiency 05/02/2021  ? Other acne 01/27/2018  ? Hyperlipidemia, mild 08/29/2015  ? Anxiety disorder due to known physiological condition 10/27/2014  ? Awareness of heartbeats 10/27/2014  ? Degeneration  of intervertebral disc of cervical region 10/27/2014  ? Headache, migraine 10/27/2014  ? ? ?No Known Allergies ? ?Past Surgical History:  ?Procedure Laterality Date  ? COLONOSCOPY WITH PROPOFOL N/A 01/12/2020  ? Procedure: COLONOSCOPY WITH PROPOFOL;  Surgeon: Lin Landsman, MD;  Location: [place];  Service: Endoscopy;  Laterality: N/A;   ? GANGLION CYST EXCISION    ? ROTATOR CUFF REPAIR    ? Uterine ablation  2010  ? ? ?Social History  ? ?Tobacco Use  ? Smoking status: Never  ? Smokeless tobacco: Never  ?Vaping Use  ? Vaping Use: Never used  ?Substance Use Topics  ? Alcohol use: No  ?  Alcohol/week: 0.0 standard drinks  ? Drug use: Never  ? ? ? ?Medication list has been reviewed and updated. ? ?Current Meds  ?Medication Sig  ? Adapalene-Benzoyl Peroxide (EPIDUO) 0.1-2.5 % gel Apply 1 application topically at bedtime.  ? Cyanocobalamin (VITAMIN B12 PO) Take by mouth daily.  ? escitalopram (LEXAPRO) 20 MG tablet Take 20 mg by mouth daily.  ? linaclotide (LINZESS) 145 MCG CAPS capsule TAKE 1 CAPSULE BY MOUTH  DAILY BEFORE BREAKFAST (Patient taking differently: as needed. TAKE 1 CAPSULE BY MOUTH  DAILY BEFORE BREAKFAST)  ? spironolactone (ALDACTONE) 100 MG tablet Take 1 tablet (100 mg total) by mouth daily.  ? valACYclovir (VALTREX) 1000 MG tablet Take 1,000 mg by mouth daily.  ? ? ? ?  05/02/2021  ?  8:02 AM 01/09/2021  ?  2:30 PM 02/03/2020  ?  8:41 AM  ?GAD 7 : Generalized Anxiety Score  ?Nervous, Anxious, on Edge 0 0 0  ?Control/stop worrying 0 0 0  ?Worry too much - different things 0 1 0  ?Trouble relaxing 0 0 0  ?Restless 0 0 0  ?Easily annoyed or irritable 0 1 0  ?Afraid - awful might happen 0 0 0  ?Total GAD 7 Score 0 2 0  ?Anxiety Difficulty Not difficult at all    ? ? ? ?  05/02/2021  ?  8:02 AM  ?Depression screen PHQ 2/9  ?Decreased Interest 0  ?Down, Depressed, Hopeless 0  ?PHQ - 2 Score 0  ?Altered sleeping 0  ?Tired, decreased energy 0  ?Change in appetite 0  ?Feeling bad or failure about yourself  0  ?Trouble concentrating 0  ?Moving slowly or fidgety/restless 0  ?Suicidal thoughts 0  ?PHQ-9 Score 0  ?Difficult doing work/chores Not difficult at all  ? ? ?BP Readings from Last 3 Encounters:  ?05/02/21 134/82  ?01/09/21 118/78  ?02/03/20 124/84  ? ? ?Physical Exam ?Vitals and nursing note reviewed.  ?Constitutional:   ?   General: She is  not in acute distress. ?   Appearance: She is well-developed.  ?HENT:  ?   Head: Normocephalic and atraumatic.  ?   Right Ear: Tympanic membrane and ear canal normal.  ?   Left Ear: Tympanic membrane and ear canal normal.  ?   Nose:  ?   Right Sinus: No maxillary sinus tenderness.  ?   Left Sinus: No maxillary sinus tenderness.  ?Eyes:  ?   General: No scleral icterus.    ?   Right eye: No discharge.     ?   Left eye: No discharge.  ?   Conjunctiva/sclera: Conjunctivae normal.  ?Neck:  ?   Thyroid: No thyromegaly.  ?   Vascular: No carotid bruit.  ?Cardiovascular:  ?   Rate and Rhythm: Normal rate and regular rhythm.  ?  Pulses: Normal pulses.  ?   Heart sounds: Normal heart sounds.  ?Pulmonary:  ?   Effort: Pulmonary effort is normal. No respiratory distress.  ?   Breath sounds: No wheezing.  ?Abdominal:  ?   General: Bowel sounds are normal.  ?   Palpations: Abdomen is soft.  ?   Tenderness: There is no abdominal tenderness.  ?Musculoskeletal:  ?   Cervical back: Normal range of motion. No erythema.  ?   Right lower leg: No edema.  ?   Left lower leg: No edema.  ?Lymphadenopathy:  ?   Cervical: No cervical adenopathy.  ?Skin: ?   General: Skin is warm and dry.  ?   Findings: No rash.  ?Neurological:  ?   Mental Status: She is alert and oriented to person, place, and time.  ?   Cranial Nerves: No cranial nerve deficit.  ?   Sensory: No sensory deficit.  ?   Deep Tendon Reflexes: Reflexes are normal and symmetric.  ?Psychiatric:     ?   Attention and Perception: Attention normal.     ?   Mood and Affect: Mood normal.  ? ? ?Wt Readings from Last 3 Encounters:  ?05/02/21 166 lb 9.6 oz (75.6 kg)  ?01/09/21 168 lb (76.2 kg)  ?02/03/20 175 lb (79.4 kg)  ? ? ?BP 134/82   Pulse 72   Ht _0  (1.651 m)   Wt 166 lb 9.6 oz (75.6 kg)   SpO2 99%   BMI 27.72 kg/m?  ? ?Assessment and Plan: ?1. Annual physical exam ?Normal exam - continue healthy diet and regular exercise ?- CBC with Differential/Platelet ?- Comprehensive  metabolic panel ?- Hemoglobin A1c ?- TSH ? ?2. Encounter for screening mammogram for breast cancer ?Done last month at GYN ? ?3. Anxiety disorder due to known physiological condition ?Doing well on Lexapro pr

## 2021-05-10 ENCOUNTER — Encounter: Payer: BC Managed Care – PPO | Admitting: Internal Medicine

## 2021-05-16 ENCOUNTER — Telehealth: Payer: Self-pay

## 2021-05-16 NOTE — Telephone Encounter (Signed)
Called pt as a reminder to get labs done. Pt verbalized understanding.   KP 

## 2021-05-21 ENCOUNTER — Encounter: Payer: Self-pay | Admitting: Internal Medicine

## 2021-05-21 DIAGNOSIS — E785 Hyperlipidemia, unspecified: Secondary | ICD-10-CM | POA: Diagnosis not present

## 2021-05-21 DIAGNOSIS — Z0000 Encounter for general adult medical examination without abnormal findings: Secondary | ICD-10-CM | POA: Diagnosis not present

## 2021-05-21 DIAGNOSIS — E559 Vitamin D deficiency, unspecified: Secondary | ICD-10-CM | POA: Diagnosis not present

## 2021-05-22 LAB — TSH: TSH: 1.3 u[IU]/mL (ref 0.450–4.500)

## 2021-05-22 LAB — COMPREHENSIVE METABOLIC PANEL
ALT: 14 IU/L (ref 0–32)
AST: 22 IU/L (ref 0–40)
Albumin/Globulin Ratio: 2 (ref 1.2–2.2)
Albumin: 4.4 g/dL (ref 3.8–4.8)
Alkaline Phosphatase: 53 IU/L (ref 44–121)
BUN/Creatinine Ratio: 19 (ref 9–23)
BUN: 15 mg/dL (ref 6–24)
Bilirubin Total: 0.5 mg/dL (ref 0.0–1.2)
CO2: 24 mmol/L (ref 20–29)
Calcium: 9.3 mg/dL (ref 8.7–10.2)
Chloride: 104 mmol/L (ref 96–106)
Creatinine, Ser: 0.78 mg/dL (ref 0.57–1.00)
Globulin, Total: 2.2 g/dL (ref 1.5–4.5)
Glucose: 85 mg/dL (ref 70–99)
Potassium: 4.2 mmol/L (ref 3.5–5.2)
Sodium: 139 mmol/L (ref 134–144)
Total Protein: 6.6 g/dL (ref 6.0–8.5)
eGFR: 94 mL/min/{1.73_m2} (ref >59)

## 2021-05-22 LAB — CBC WITH DIFFERENTIAL/PLATELET
Basophils Absolute: 0.1 10*3/uL (ref 0.0–0.2)
Basos: 1 %
EOS (ABSOLUTE): 0.2 10*3/uL (ref 0.0–0.4)
Eos: 3 %
Hematocrit: 43.9 % (ref 34.0–46.6)
Hemoglobin: 15.3 g/dL (ref 11.1–15.9)
Immature Grans (Abs): 0 10*3/uL (ref 0.0–0.1)
Immature Granulocytes: 0 %
Lymphocytes Absolute: 1.8 10*3/uL (ref 0.7–3.1)
Lymphs: 33 %
MCH: 31.5 pg (ref 26.6–33.0)
MCHC: 34.9 g/dL (ref 31.5–35.7)
MCV: 91 fL (ref 79–97)
Monocytes Absolute: 0.3 10*3/uL (ref 0.1–0.9)
Monocytes: 6 %
Neutrophils Absolute: 3.1 10*3/uL (ref 1.4–7.0)
Neutrophils: 57 %
Platelets: 176 10*3/uL (ref 150–450)
RBC: 4.85 x10E6/uL (ref 3.77–5.28)
RDW: 12.1 % (ref 11.7–15.4)
WBC: 5.5 10*3/uL (ref 3.4–10.8)

## 2021-05-22 LAB — LIPID PANEL
Chol/HDL Ratio: 2.8 ratio (ref 0.0–4.4)
Cholesterol, Total: 179 mg/dL (ref 100–199)
HDL: 64 mg/dL (ref >39)
LDL Chol Calc (NIH): 98 mg/dL (ref 0–99)
Triglycerides: 92 mg/dL (ref 0–149)
VLDL Cholesterol Cal: 17 mg/dL (ref 5–40)

## 2021-05-22 LAB — VITAMIN D 25 HYDROXY (VIT D DEFICIENCY, FRACTURES): Vit D, 25-Hydroxy: 34.1 ng/mL (ref 30.0–100.0)

## 2021-05-22 LAB — HEMOGLOBIN A1C
Est. average glucose Bld gHb Est-mCnc: 94 mg/dL
Hgb A1c MFr Bld: 4.9 % (ref 4.8–5.6)

## 2021-07-03 ENCOUNTER — Other Ambulatory Visit: Payer: Self-pay | Admitting: Dermatology

## 2021-07-03 DIAGNOSIS — L700 Acne vulgaris: Secondary | ICD-10-CM

## 2021-07-04 ENCOUNTER — Other Ambulatory Visit: Payer: Self-pay | Admitting: Dermatology

## 2021-07-04 DIAGNOSIS — L700 Acne vulgaris: Secondary | ICD-10-CM

## 2021-09-02 ENCOUNTER — Ambulatory Visit: Payer: BC Managed Care – PPO | Admitting: Dermatology

## 2021-11-20 ENCOUNTER — Encounter: Payer: Self-pay | Admitting: Internal Medicine

## 2021-11-20 ENCOUNTER — Other Ambulatory Visit: Payer: Self-pay

## 2021-11-20 ENCOUNTER — Ambulatory Visit (INDEPENDENT_AMBULATORY_CARE_PROVIDER_SITE_OTHER): Payer: No Typology Code available for payment source | Admitting: Internal Medicine

## 2021-11-20 VITALS — BP 110/78 | HR 66 | Ht 65.0 in | Wt 166.0 lb

## 2021-11-20 DIAGNOSIS — M7062 Trochanteric bursitis, left hip: Secondary | ICD-10-CM

## 2021-11-20 MED ORDER — PREDNISONE 10 MG PO TABS
ORAL_TABLET | ORAL | 0 refills | Status: AC
  Filled 2021-11-20: qty 42, 12d supply, fill #0

## 2021-11-20 NOTE — Progress Notes (Signed)
Date:  11/20/2021   Name:  Deborah Mills   DOB:  [birth year]   MRN:  [id number]   Chief Complaint: Joint Pain (Both hips, left hip more)  Hip Pain  Incident onset: X2 years. There was no injury mechanism. The pain is present in the left hip and right hip. The quality of the pain is described as aching (dull). The pain is at a severity of 4/10. The pain is mild. The pain has been Constant since onset. Pertinent negatives include no inability to bear weight, loss of motion, loss of sensation, muscle weakness, numbness or tingling. Associated symptoms comments: Muscles aching. She reports no foreign bodies present. Exacerbated by: lying on the left side. She has tried heat, ice and NSAIDs for the symptoms. The treatment provided mild relief.    Lab Results  Component Value Date   NA 139 05/21/2021   K 4.2 05/21/2021   CO2 24 05/21/2021   GLUCOSE 85 05/21/2021   BUN 15 05/21/2021   CREATININE 0.78 05/21/2021   CALCIUM 9.3 05/21/2021   EGFR 94 05/21/2021   GFRNONAA 88 02/14/2019   Lab Results  Component Value Date   CHOL 179 05/21/2021   HDL 64 05/21/2021   LDLCALC 98 05/21/2021   TRIG 92 05/21/2021   CHOLHDL 2.8 05/21/2021   Lab Results  Component Value Date   TSH 1.300 05/21/2021   Lab Results  Component Value Date   HGBA1C 4.9 05/21/2021   Lab Results  Component Value Date   WBC 5.5 05/21/2021   HGB 15.3 05/21/2021   HCT 43.9 05/21/2021   MCV 91 05/21/2021   PLT 176 05/21/2021   Lab Results  Component Value Date   ALT 14 05/21/2021   AST 22 05/21/2021   ALKPHOS 53 05/21/2021   BILITOT 0.5 05/21/2021   Lab Results  Component Value Date   VD25OH 34.1 05/21/2021     Review of Systems  Constitutional:  Negative for appetite change, fatigue and fever.  Respiratory:  Negative for chest tightness and shortness of breath.   Musculoskeletal:  Positive for arthralgias and myalgias. Negative for joint swelling.  Neurological:  Negative for tingling and numbness.   Psychiatric/Behavioral:  Positive for sleep disturbance. Negative for dysphoric mood. The patient is not nervous/anxious.     Patient Active Problem List   Diagnosis Date Noted  . Vitamin D deficiency 05/02/2021  . Other acne 01/27/2018  . Hyperlipidemia, mild 08/29/2015  . Anxiety disorder due to known physiological condition 10/27/2014  . Awareness of heartbeats 10/27/2014  . Degeneration of intervertebral disc of cervical region 10/27/2014  . Headache, migraine 10/27/2014    No Known Allergies  Past Surgical History:  Procedure Laterality Date  . COLONOSCOPY WITH PROPOFOL N/A 01/12/2020   Procedure: COLONOSCOPY WITH PROPOFOL;  Surgeon: Lin Landsman, MD;  Location: [place];  Service: Endoscopy;  Laterality: N/A;  . GANGLION CYST EXCISION    . ROTATOR CUFF REPAIR    . Uterine ablation  2010    Social History   Tobacco Use  . Smoking status: Never  . Smokeless tobacco: Never  Vaping Use  . Vaping Use: Never used  Substance Use Topics  . Alcohol use: No    Alcohol/week: 0.0 standard drinks of alcohol  . Drug use: Never     Medication list has been reviewed and updated.  Current Meds  Medication Sig  . Adapalene-Benzoyl Peroxide (EPIDUO) 0.1-2.5 % gel Apply 1 application topically at bedtime.  Marland Kitchen escitalopram (  LEXAPRO) 20 MG tablet Take 20 mg by mouth daily.  Marland Kitchen linaclotide (LINZESS) 145 MCG CAPS capsule TAKE 1 CAPSULE BY MOUTH  DAILY BEFORE BREAKFAST (Patient taking differently: as needed. TAKE 1 CAPSULE BY MOUTH  DAILY BEFORE BREAKFAST)  . spironolactone (ALDACTONE) 100 MG tablet TAKE 1 TABLET BY MOUTH  DAILY  . [DISCONTINUED] Cyanocobalamin (VITAMIN B12 PO) Take by mouth daily.  . [DISCONTINUED] valACYclovir (VALTREX) 1000 MG tablet Take 1,000 mg by mouth daily.       11/20/2021    3:43 PM 05/02/2021    8:02 AM 01/09/2021    2:30 PM 02/03/2020    8:41 AM  GAD 7 : Generalized Anxiety Score  Nervous, Anxious, on Edge 0 0 0 0  Control/stop  worrying 0 0 0 0  Worry too much - different things 0 0 1 0  Trouble relaxing 0 0 0 0  Restless 0 0 0 0  Easily annoyed or irritable 0 0 1 0  Afraid - awful might happen 0 0 0 0  Total GAD 7 Score 0 0 2 0  Anxiety Difficulty Not difficult at all Not difficult at all         11/20/2021    3:43 PM 05/02/2021    8:02 AM 01/09/2021    2:29 PM  Depression screen PHQ 2/9  Decreased Interest 0 0 0  Down, Depressed, Hopeless 0 0 0  PHQ - 2 Score 0 0 0  Altered sleeping 0 0 0  Tired, decreased energy 3 0 0  Change in appetite 0 0 0  Feeling bad or failure about yourself  0 0 1  Trouble concentrating 0 0 0  Moving slowly or fidgety/restless 0 0 0  Suicidal thoughts 0 0 0  PHQ-9 Score 3 0 1  Difficult doing work/chores Not difficult at all Not difficult at all     BP Readings from Last 3 Encounters:  11/20/21 110/78  05/02/21 126/84  01/09/21 118/78    Physical Exam Vitals and nursing note reviewed.  Constitutional:      General: She is not in acute distress.    Appearance: She is well-developed.  HENT:     Head: Normocephalic and atraumatic.  Cardiovascular:     Rate and Rhythm: Normal rate and regular rhythm.  Pulmonary:     Effort: Pulmonary effort is normal. No respiratory distress.     Breath sounds: No wheezing or rhonchi.  Musculoskeletal:     Lumbar back: Normal.     Right hip: Normal.     Left hip: Tenderness present. Decreased range of motion.  Skin:    General: Skin is warm and dry.     Findings: No rash.  Neurological:     Mental Status: She is alert and oriented to person, place, and time.  Psychiatric:        Mood and Affect: Mood normal.        Behavior: Behavior normal.    Wt Readings from Last 3 Encounters:  11/20/21 166 lb (75.3 kg)  05/02/21 166 lb 9.6 oz (75.6 kg)  01/09/21 168 lb (76.2 kg)    BP 110/78   Pulse 66   Ht _0  (1.651 m)   Wt 166 lb (75.3 kg)   SpO2 98%   BMI 27.62 kg/m   Assessment and Plan: 1. Trochanteric bursitis of  left hip Recommend steroid taper followed by Aleve bid May use heat or ice topically if needed. - predniSONE (DELTASONE) 10 MG tablet; Take 6  on day 1and 2, 5 on day 3 and 4, 4 on day 5 and 6 , 3 on day 7 and 8, 2 on day 9 and 10 and 1 on day 11 and 12 then stop.  Dispense: 42 tablet; Refill: 0   Partially dictated using Editor, commissioning. Any errors are unintentional.  Halina Maidens, MD [facility]  11/20/2021

## 2021-11-28 ENCOUNTER — Other Ambulatory Visit: Payer: Self-pay

## 2021-11-28 MED ORDER — PHENTERMINE HCL 37.5 MG PO TABS
37.5000 mg | ORAL_TABLET | Freq: Every morning | ORAL | 0 refills | Status: DC
  Filled 2021-11-28: qty 30, 30d supply, fill #0

## 2021-12-27 ENCOUNTER — Other Ambulatory Visit: Payer: Self-pay

## 2021-12-27 MED ORDER — PHENTERMINE HCL 37.5 MG PO TABS
ORAL_TABLET | ORAL | 2 refills | Status: DC
  Filled 2021-12-27: qty 30, 30d supply, fill #0
  Filled 2022-02-12: qty 30, 30d supply, fill #1
  Filled 2022-03-28: qty 30, 30d supply, fill #2

## 2022-01-20 ENCOUNTER — Encounter: Payer: Self-pay | Admitting: Emergency Medicine

## 2022-01-20 ENCOUNTER — Other Ambulatory Visit: Payer: Self-pay

## 2022-01-20 ENCOUNTER — Ambulatory Visit
Admission: EM | Admit: 2022-01-20 | Discharge: 2022-01-20 | Disposition: A | Discharge status: Home / Self Care | Payer: No Typology Code available for payment source | Attending: Physician Assistant | Admitting: Physician Assistant

## 2022-01-20 DIAGNOSIS — J069 Acute upper respiratory infection, unspecified: Secondary | ICD-10-CM

## 2022-01-20 DIAGNOSIS — J029 Acute pharyngitis, unspecified: Secondary | ICD-10-CM | POA: Diagnosis not present

## 2022-01-20 LAB — POCT RAPID STREP A (OFFICE): Rapid Strep A Screen: NEGATIVE

## 2022-01-20 MED ORDER — IBUPROFEN 600 MG PO TABS: 600.0000 mg | ORAL_TABLET | Freq: Four times a day (QID) | ORAL | 0 refills | Status: DC | PRN

## 2022-01-20 MED ORDER — AMOXICILLIN 500 MG PO CAPS: 500.0000 mg | ORAL_CAPSULE | Freq: Three times a day (TID) | ORAL | 0 refills | Status: DC

## 2022-01-20 NOTE — ED Triage Notes (Signed)
Pt sts sore throat with pain into right ear and neck x 3 days

## 2022-01-20 NOTE — Discharge Instructions (Addendum)
Advised take ibuprofen milligrams every 6 hours with food to help reduce pain and swelling. Advised to start the amoxicillin 500 mg 3 times a day until completed to treat the infection. Advised to follow-up with PCP or return to urgent care if symptoms fail to improve within the next week.

## 2022-01-20 NOTE — ED Provider Notes (Signed)
[facility]    CSN: [id number] Arrival date & time: 01/20/22  1112      History   Chief Complaint Chief Complaint  Patient presents with   Sore Throat    HPI Deborah Mills is a 47 y.o. female.   47 year old female presents with sore throat and congestion.  Patient indicates over the past 3 days she has been having progressive sore throat and painful swallowing.  She indicates she has been having some upper respiratory congestion with frontal and maxillary pressure, postnasal drip and rhinitis with purulent green production.  Patient also indicates she has been having some right ear pain also.  She also indicates that she has been having some swelling of the nose on the right side of the neck.  She has been having mild chest congestion and cough with very little production.  Patient denies fever but she has had some chills.  Patient denies nausea or vomiting.  She has been taking some ibuprofen and DayQuil with minimal improvement in her symptoms.  She relates she does have a tendency to have tonsillitis on a frequent basis.   Sore Throat    Past Medical History:  Diagnosis Date   Atypical mole 2006   left lower leg, exc   Hemorrhoids    PONV (postoperative nausea and vomiting)    also, slow to wake    Patient Active Problem List   Diagnosis Date Noted   Vitamin D deficiency 05/02/2021   Other acne 01/27/2018   Hyperlipidemia, mild 08/29/2015   Anxiety disorder due to known physiological condition 10/27/2014   Awareness of heartbeats 10/27/2014   Degeneration of intervertebral disc of cervical region 10/27/2014   Headache, migraine 10/27/2014    Past Surgical History:  Procedure Laterality Date   COLONOSCOPY WITH PROPOFOL N/A 01/12/2020   Procedure: COLONOSCOPY WITH PROPOFOL;  Surgeon: Lin Landsman, MD;  Location: [place];  Service: Endoscopy;  Laterality: N/A;   GANGLION CYST EXCISION     ROTATOR CUFF REPAIR     Uterine ablation   2010    OB History   No obstetric history on file.      Home Medications    Prior to Admission medications   Medication Sig Start Date End Date Taking? Authorizing Provider  amoxicillin (AMOXIL) 500 MG capsule Take 1 capsule (500 mg total) by mouth 3 (three) times daily. 01/20/22  Yes Nyoka Lint, PA-C  ibuprofen (ADVIL) 600 MG tablet Take 1 tablet (600 mg total) by mouth every 6 (six) hours as needed. 01/20/22  Yes Nyoka Lint, PA-C  Adapalene-Benzoyl Peroxide (EPIDUO) 0.1-2.5 % gel Apply 1 application topically at bedtime. 08/22/20   Brendolyn Patty, MD  escitalopram (LEXAPRO) 20 MG tablet Take 20 mg by mouth daily.    [provider]  linaclotide (LINZESS) 145 MCG CAPS capsule TAKE 1 CAPSULE BY MOUTH  DAILY BEFORE BREAKFAST Patient taking differently: as needed. TAKE 1 CAPSULE BY MOUTH  DAILY BEFORE BREAKFAST 01/03/21   Lin Landsman, MD  phentermine (ADIPEX-P) 37.5 MG tablet Take 1 tablet every day by oral route in the morning. 12/27/21     spironolactone (ALDACTONE) 100 MG tablet TAKE 1 TABLET BY MOUTH  DAILY 07/03/21   Brendolyn Patty, MD    Family History Family History  Problem Relation Age of Onset   Hyperlipidemia Mother    Diabetes Mother     Social History Social History   Tobacco Use   Smoking status: Never   Smokeless tobacco: Never  Vaping Use   Vaping Use: Never used  Substance Use Topics   Alcohol use: No    Alcohol/week: 0.0 standard drinks of alcohol   Drug use: Never     Allergies   Patient has no known allergies.   Review of Systems Review of Systems  HENT:  Positive for postnasal drip, sinus pressure and sore throat (moderate).      Physical Exam Triage Vital Signs ED Triage Vitals [01/20/22 1215]  Enc Vitals Group     BP (!) 144/94     Pulse Rate 87     Resp 18     Temp 98.3 F (36.8 C)     Temp Source Oral     SpO2 97 %     Weight      Height      Head Circumference      Peak Flow      Pain Score 5     Pain Loc       Pain Edu?      Excl. in [place]?    No data found.  Updated Vital Signs BP (!) 144/94 (BP Location: Left Arm)   Pulse 87   Temp 98.3 F (36.8 C) (Oral)   Resp 18   SpO2 97%   Visual Acuity Right Eye Distance:   Left Eye Distance:   Bilateral Distance:    Right Eye Near:   Left Eye Near:    Bilateral Near:     Physical Exam Constitutional:      Appearance: She is well-developed.  HENT:     Right Ear: Ear canal normal. Tympanic membrane is injected.     Left Ear: Ear canal normal. Tympanic membrane is injected.     Mouth/Throat:     Pharynx: Oropharyngeal exudate (right side mild) and posterior oropharyngeal erythema present.  Cardiovascular:     Rate and Rhythm: Normal rate and regular rhythm.     Heart sounds: Normal heart sounds.  Pulmonary:     Effort: Pulmonary effort is normal.     Breath sounds: Normal breath sounds and air entry. No wheezing, rhonchi or rales.  Lymphadenopathy:     Cervical: Cervical adenopathy (moderate right anteror cervical adenopathy present) present.  Neurological:     Mental Status: She is alert.      UC Treatments / Results  Labs (all labs ordered are listed, but only abnormal results are displayed) Labs Reviewed  POCT RAPID STREP A (OFFICE)    EKG   Radiology No results found.  Procedures Procedures (including critical care time)  Medications Ordered in UC Medications - No data to display  Initial Impression / Assessment and Plan / UC Course  I have reviewed the triage vital signs and the nursing notes.  Pertinent labs & imaging results that were available during my care of the patient were reviewed by me and considered in my medical decision making (see chart for details).    Plan: 1.  The acute upper respiratory infection be treated with the following: A.  Advised take DayQuil or NyQuil to help control the congestion. 2.  The sore throat will be treated with the following: A.  Ibuprofen 600 mg every 8 hours to help  reduce pain and discomfort. B.  Amoxil 500 mg every 8 hours to treat the infection. 3. 4.  Advised follow-up PCP or return to urgent care if symptoms fail to improve. Final Clinical Impressions(s) / UC Diagnoses   Final diagnoses:  Acute upper respiratory infection  Sore throat     Discharge Instructions      Advised take ibuprofen milligrams every 6 hours with food to help reduce pain and swelling. Advised to start the amoxicillin 500 mg 3 times a day until completed to treat the infection. Advised to follow-up with PCP or return to urgent care if symptoms fail to improve within the next week.    ED Prescriptions     Medication Sig Dispense Auth. Provider   ibuprofen (ADVIL) 600 MG tablet Take 1 tablet (600 mg total) by mouth every 6 (six) hours as needed. 30 tablet Nyoka Lint, PA-C   amoxicillin (AMOXIL) 500 MG capsule Take 1 capsule (500 mg total) by mouth 3 (three) times daily. 21 capsule Nyoka Lint, PA-C      PDMP not reviewed this encounter.   Nyoka Lint, PA-C 01/20/22 1254

## 2022-02-12 ENCOUNTER — Other Ambulatory Visit: Payer: Self-pay

## 2022-03-11 ENCOUNTER — Ambulatory Visit: Payer: No Typology Code available for payment source | Admitting: Dermatology

## 2022-03-11 ENCOUNTER — Other Ambulatory Visit: Payer: Self-pay

## 2022-03-11 ENCOUNTER — Encounter: Payer: Self-pay | Admitting: Dermatology

## 2022-03-11 VITALS — BP 117/81 | HR 83

## 2022-03-11 DIAGNOSIS — L821 Other seborrheic keratosis: Secondary | ICD-10-CM

## 2022-03-11 DIAGNOSIS — L578 Other skin changes due to chronic exposure to nonionizing radiation: Secondary | ICD-10-CM

## 2022-03-11 DIAGNOSIS — Z86018 Personal history of other benign neoplasm: Secondary | ICD-10-CM

## 2022-03-11 DIAGNOSIS — L720 Epidermal cyst: Secondary | ICD-10-CM

## 2022-03-11 DIAGNOSIS — D229 Melanocytic nevi, unspecified: Secondary | ICD-10-CM

## 2022-03-11 DIAGNOSIS — L700 Acne vulgaris: Secondary | ICD-10-CM

## 2022-03-11 DIAGNOSIS — D225 Melanocytic nevi of trunk: Secondary | ICD-10-CM

## 2022-03-11 DIAGNOSIS — Z1283 Encounter for screening for malignant neoplasm of skin: Secondary | ICD-10-CM

## 2022-03-11 DIAGNOSIS — L814 Other melanin hyperpigmentation: Secondary | ICD-10-CM

## 2022-03-11 MED ORDER — ADAPALENE-BENZOYL PEROXIDE 0.1-2.5 % EX GEL
1.0000 "application " | Freq: Every day | CUTANEOUS | 5 refills | Status: DC
  Filled 2022-03-11: qty 45, 22d supply, fill #0
  Filled 2022-04-15: qty 45, 60d supply, fill #0
  Filled 2022-04-17: qty 45, 90d supply, fill #0
  Filled 2022-09-01 – 2023-02-12 (×4): qty 45, 30d supply, fill #0

## 2022-03-11 MED ORDER — SPIRONOLACTONE 100 MG PO TABS
100.0000 mg | ORAL_TABLET | Freq: Every day | ORAL | 3 refills | Status: DC
  Filled 2022-03-11 – 2022-03-28 (×2): qty 90, 90d supply, fill #0
  Filled 2022-09-01: qty 90, 90d supply, fill #1
  Filled 2023-02-12: qty 90, 90d supply, fill #2

## 2022-03-11 NOTE — Progress Notes (Signed)
Follow-Up Visit   Subjective  Deborah Mills is a 47 y.o. female who presents for the following: Annual Exam (1 year tbse, hx of dysplastic nevi , hx of acne ).  Needs rfs acne meds.  The patient presents for Total-Body Skin Exam (TBSE) for skin cancer screening and mole check.  The patient has spots, moles and lesions to be evaluated, some may be new or changing and the patient has concerns that these could be cancer.   The following portions of the chart were reviewed this encounter and updated as appropriate:      Review of Systems: No other skin or systemic complaints except as noted in HPI or Assessment and Plan.   Objective  Well appearing patient in no apparent distress; mood and affect are within normal limits.  A full examination was performed including scalp, head, eyes, ears, nose, lips, neck, chest, axillae, abdomen, back, buttocks, bilateral upper extremities, bilateral lower extremities, hands, feet, fingers, toes, fingernails, and toenails. All findings within normal limits unless otherwise noted below.  face Resolving inflammatory papule on left cheek and resolving inflammatory papules at neck  left upper breast 33m medium dark brown macule with notch  left upper flank 6 x 435mspeckled medium  brown papule    right super pubic 6 x 3 mm two-tone brown macule   Left Upper Back 4 mm firm flesh papule    Assessment & Plan  Acne vulgaris face  Chronic condition with duration or expected duration over one year. Currently well-controlled.   BP 117/81 Continue Spironolactone 10047make 1 po QD dsp #90    Continue generic Epiduo Gel QD prn dsp 45g 5Rf   Spironolactone can cause increased urination and cause blood pressure to decrease. Please watch for signs of lightheadedness and be cautious when changing position. It can sometimes cause breast tenderness or an irregular period in premenopausal women. It can also increase potassium. The increase in  potassium usually is not a concern unless you are taking other medicines that also increase potassium, so please be sure your doctor knows all of the other medications you are taking. This medication should not be taken by pregnant women.  This medicine should also not be taken together with sulfa drugs like Bactrim (trimethoprim/sulfamethexazole).    Topical retinoid medications like tretinoin/Retin-A, adapalene/Differin, tazarotene/Fabior, and Epiduo/Epiduo Forte can cause dryness and irritation when first started. Only apply a pea-sized amount to the entire affected area. Avoid applying it around the eyes, edges of mouth and creases at the nose. If you experience irritation, use a good moisturizer first and/or apply the medicine less often. If you are doing well with the medicine, you can increase how often you use it until you are applying every night. Be careful with sun protection while using this medication as it can make you sensitive to the sun. This medicine should not be used by pregnant women.    Benzoyl peroxide can cause dryness and irritation of the skin. It can also bleach fabric. When used together with Aczone (dapsone) cream, it can stain the skin orange.  Related Medications spironolactone (ALDACTONE) 100 MG tablet Take 1 tablet (100 mg total) by mouth daily.  Adapalene-Benzoyl Peroxide 0.1-2.5 % gel Apply 1 application  topically at bedtime.  Nevus (3) left upper breast; right super pubic; left upper flank  Benign-appearing. Stable compared to previous visit. Observation.  Call clinic for new or changing moles.  Recommend daily use of broad spectrum spf 30+ sunscreen to sun-exposed  areas.    Epidermal inclusion cyst Left Upper Back  Benign-appearing. Exam most consistent with an epidermal inclusion cyst. Discussed that a cyst is a benign growth that can grow over time and sometimes get irritated or inflamed. Recommend observation if it is not bothersome. Discussed option of  surgical excision to remove it if it is growing, symptomatic, or other changes noted. Please call for new or changing lesions so they can be evaluated.    Lentigines - Scattered tan macules - Due to sun exposure - Benign-appearing, observe - Recommend daily broad spectrum sunscreen SPF 30+ to sun-exposed areas, reapply every 2 hours as needed. - Call for any changes  Left upper arm excoriations Pink excoriated papules   Telangiectasia Central nasal tip  - 71m blanching pink macule   Dilated blood vessel - Benign appearing on exam - Call for changes  Dermatofibroma Left upper back - Firm pink/brown papulenodule with dimple sign - Benign appearing - Call for any changes  Seborrheic Keratoses - Stuck-on, waxy, tan-brown papules and/or plaques  - Benign-appearing - Discussed benign etiology and prognosis. - Observe - Call for any changes  Melanocytic Nevi - Tan-brown and/or pink-flesh-colored symmetric macules and papules - Benign appearing on exam today - Observation - Call clinic for new or changing moles - Recommend daily use of broad spectrum spf 30+ sunscreen to sun-exposed areas.   Hemangiomas - Red papules - Discussed benign nature - Observe - Call for any changes  Actinic Damage - Chronic condition, secondary to cumulative UV/sun exposure - diffuse scaly erythematous macules with underlying dyspigmentation - Recommend daily broad spectrum sunscreen SPF 30+ to sun-exposed areas, reapply every 2 hours as needed.  - Staying in the shade or wearing long sleeves, sun glasses (UVA+UVB protection) and wide brim hats (4-inch brim around the entire circumference of the hat) are also recommended for sun protection.  - Call for new or changing lesions.  History of Dysplastic Nevi 2006 left lower leg  - No evidence of recurrence today - Recommend regular full body skin exams - Recommend daily broad spectrum sunscreen SPF 30+ to sun-exposed areas, reapply every 2  hours as needed.  - Call if any new or changing lesions are noted between office visits  Skin cancer screening performed today. Return in about 1 year (around 03/12/2023) for TBSE.  I, MRuthell Rummage CMA, am acting as scribe for TBrendolyn Patty MD.  Documentation: I have reviewed the above documentation for accuracy and completeness, and I agree with the above.  TBrendolyn PattyMD

## 2022-03-11 NOTE — Patient Instructions (Addendum)
Spironolactone can cause increased urination and cause blood pressure to decrease. Please watch for signs of lightheadedness and be cautious when changing position. It can sometimes cause breast tenderness or an irregular period in premenopausal women. It can also increase potassium. The increase in potassium usually is not a concern unless you are taking other medicines that also increase potassium, so please be sure your doctor knows all of the other medications you are taking. This medication should not be taken by pregnant women.  This medicine should also not be taken together with sulfa drugs like Bactrim (trimethoprim/sulfamethexazole).    Topical retinoid medications like tretinoin/Retin-A, adapalene/Differin, tazarotene/Fabior, and Epiduo/Epiduo Forte can cause dryness and irritation when first started. Only apply a pea-sized amount to the entire affected area. Avoid applying it around the eyes, edges of mouth and creases at the nose. If you experience irritation, use a good moisturizer first and/or apply the medicine less often. If you are doing well with the medicine, you can increase how often you use it until you are applying every night. Be careful with sun protection while using this medication as it can make you sensitive to the sun. This medicine should not be used by pregnant women.      Melanoma ABCDEs  Melanoma is the most dangerous type of skin cancer, and is the leading cause of death from skin disease.  You are more likely to develop melanoma if you: Have light-colored skin, light-colored eyes, or red or blond hair Spend a lot of time in the sun Tan regularly, either outdoors or in a tanning bed Have had blistering sunburns, especially during childhood Have a close family member who has had a melanoma Have atypical moles or large birthmarks  Early detection of melanoma is key since treatment is typically straightforward and cure rates are extremely high if we catch it early.    The first sign of melanoma is often a change in a mole or a new dark spot.  The ABCDE system is a way of remembering the signs of melanoma.  A for asymmetry:  The two halves do not match. B for border:  The edges of the growth are irregular. C for color:  A mixture of colors are present instead of an even brown color. D for diameter:  Melanomas are usually (but not always) greater than 37m - the size of a pencil eraser. E for evolution:  The spot keeps changing in size, shape, and color.  Please check your skin once per month between visits. You can use a small mirror in front and a large mirror behind you to keep an eye on the back side or your body.   If you see any new or changing lesions before your next follow-up, please call to schedule a visit.  Please continue daily skin protection including broad spectrum sunscreen SPF 30+ to sun-exposed areas, reapplying every 2 hours as needed when you're outdoors.   Staying in the shade or wearing long sleeves, sun glasses (UVA+UVB protection) and wide brim hats (4-inch brim around the entire circumference of the hat) are also recommended for sun protection.     Due to recent changes in healthcare laws, you may see results of your pathology and/or laboratory studies on MyChart before the doctors have had a chance to review them. We understand that in some cases there may be results that are confusing or concerning to you. Please understand that not all results are received at the same time and often the doctors  may need to interpret multiple results in order to provide you with the best plan of care or course of treatment. Therefore, we ask that you please give [place] 2 business days to thoroughly review all your results before contacting the office for clarification. Should we see a critical lab result, you will be contacted sooner.   If You Need Anything After Your Visit  If you have any questions or concerns for your doctor, please call our main  line at [phone] and press option 4 to reach your doctor's medical assistant. If no one answers, please leave a voicemail as directed and we will return your call as soon as possible. Messages left after 4 pm will be answered the following business day.   You may also send [place] a message via [place]. We typically respond to MyChart messages within 1-2 business days.  For prescription refills, please ask your pharmacy to contact our office. Our fax number is [phone].  If you have an urgent issue when the clinic is closed that cannot wait until the next business day, you can page your doctor at the number below.    Please note that while we do our best to be available for urgent issues outside of office hours, we are not available 24/7.   If you have an urgent issue and are unable to reach [place], you may choose to seek medical care at your doctor's office, retail clinic, urgent care center, or emergency room.  If you have a medical emergency, please immediately call 911 or go to the emergency department.  Pager Numbers  - Dr. Nehemiah Massed: [phone]  - Dr. Laurence Ferrari: [phone]  - Dr. Nicole Kindred: [phone]  In the event of inclement weather, please call our main line at [phone] for an update on the status of any delays or closures.  Dermatology Medication Tips: Please keep the boxes that topical medications come in in order to help keep track of the instructions about where and how to use these. Pharmacies typically print the medication instructions only on the boxes and not directly on the medication tubes.   If your medication is too expensive, please contact our office at [phone] option 4 or send [place] a message through [place].   We are unable to tell what your co-pay for medications will be in advance as this is different depending on your insurance coverage. However, we may be able to find a substitute medication at lower cost or fill out paperwork to get insurance to cover a  needed medication.   If a prior authorization is required to get your medication covered by your insurance company, please allow [place] 1-2 business days to complete this process.  Drug prices often vary depending on where the prescription is filled and some pharmacies may offer cheaper prices.  The website www.goodrx.com contains coupons for medications through different pharmacies. The prices here do not account for what the cost may be with help from insurance (it may be cheaper with your insurance), but the website can give you the price if you did not use any insurance.  - You can print the associated coupon and take it with your prescription to the pharmacy.  - You may also stop by our office during regular business hours and pick up a GoodRx coupon card.  - If you need your prescription sent electronically to a different pharmacy, notify our office through [facility] or by phone at [phone] option 4.     Si Usted Necesita Algo Despus  de Su Visita  Tambin puede enviarnos un mensaje a travs de Pharmacist, community. Por lo general respondemos a los mensajes de MyChart en el transcurso de 1 a 2 das hbiles.  Para renovar recetas, por favor pida a su farmacia que se ponga en contacto con nuestra oficina. Harland Dingwall de fax es [place] [phone].  Si tiene un asunto urgente cuando la clnica est cerrada y que no puede esperar hasta el siguiente da hbil, puede llamar/localizar a su doctor(a) al nmero que aparece a continuacin.   Por favor, tenga en cuenta que aunque hacemos todo lo posible para estar disponibles para asuntos urgentes fuera del horario de [place], no estamos disponibles las 24 horas del da, los 7 das de la [place].   Si tiene un problema urgente y no puede comunicarse con nosotros, puede optar por buscar atencin mdica  en el consultorio de su doctor(a), en una clnica privada, en un centro de atencin urgente o en una sala de emergencias.  Si tiene Architect, por favor llame inmediatamente al 911 o vaya a la sala de emergencias.  Nmeros de bper  - Dr. Nehemiah Massed: [phone]  - Dra. Moye: [phone]  - Dra. Nicole Kindred: [phone]  En caso de inclemencias del [place], por favor llame a Johnsie Kindred principal al [phone] para una actualizacin sobre el [place] de cualquier retraso o cierre.  Consejos para la medicacin en dermatologa: Por favor, guarde las cajas en las que vienen los medicamentos de uso tpico para ayudarle a seguir las instrucciones sobre dnde y cmo usarlos. Las farmacias generalmente imprimen las instrucciones del medicamento slo en las cajas y no directamente en los tubos del [place].   Si su medicamento es muy caro, por favor, pngase en contacto con Zigmund Daniel llamando al [phone] y presione la opcin 4 o envenos un mensaje a travs de Pharmacist, community.   No podemos decirle cul ser su copago por los medicamentos por adelantado ya que esto es diferente dependiendo de la cobertura de su seguro. Sin embargo, es posible que podamos encontrar un medicamento sustituto a Electrical engineer un formulario para que el seguro cubra el medicamento que se considera necesario.   Si se requiere una autorizacin previa para que su compaa de seguros [place] su medicamento, por favor permtanos de 1 a 2 das hbiles para completar este proceso.  Los precios de los medicamentos varan con frecuencia dependiendo del Environmental consultant de dnde se surte la receta y alguna farmacias pueden ofrecer precios ms baratos.  El sitio web www.goodrx.com tiene cupones para medicamentos de Airline pilot. Los precios aqu no tienen en cuenta lo que podra costar con la ayuda del seguro (puede ser ms barato con su seguro), pero el sitio web puede darle el precio si no utiliz Research scientist (physical sciences).  - Puede imprimir el cupn correspondiente y llevarlo con su receta a la farmacia.  - Tambin puede pasar por nuestra oficina durante el horario de  atencin regular y Charity fundraiser una tarjeta de cupones de GoodRx.  - Si necesita que su receta se enve electrnicamente a una farmacia diferente, informe a nuestra oficina a travs de MyChart de [place] o por telfono llamando al [phone] y presione la opcin 4.

## 2022-03-25 ENCOUNTER — Other Ambulatory Visit: Payer: Self-pay

## 2022-03-28 ENCOUNTER — Other Ambulatory Visit: Payer: Self-pay

## 2022-04-10 ENCOUNTER — Ambulatory Visit (INDEPENDENT_AMBULATORY_CARE_PROVIDER_SITE_OTHER): Payer: 59 | Admitting: Family Medicine

## 2022-04-10 ENCOUNTER — Encounter: Payer: Self-pay | Admitting: Family Medicine

## 2022-04-10 ENCOUNTER — Other Ambulatory Visit: Payer: Self-pay

## 2022-04-10 VITALS — BP 120/80 | HR 70 | Ht 65.0 in

## 2022-04-10 DIAGNOSIS — M722 Plantar fascial fibromatosis: Secondary | ICD-10-CM

## 2022-04-10 MED ORDER — MELOXICAM 15 MG PO TABS
15.0000 mg | ORAL_TABLET | Freq: Every day | ORAL | 0 refills | Status: DC
  Filled 2022-04-10: qty 30, 30d supply, fill #0

## 2022-04-10 NOTE — Progress Notes (Signed)
     Primary Care / Sports Medicine Office Visit  Patient Information:  Patient ID: Deborah Mills, female DOB: [birth year] Age: 47 y.o. MRN: [id number]   Deborah Mills is a pleasant 47 y.o. female presenting with the following:  Chief Complaint  Patient presents with   Foot Pain    foot pain, left, has orthotics, meloxicam helps     Vitals:   04/10/22 1420  BP: 120/80  Pulse: 70  SpO2: 99%   Vitals:   04/10/22 1420  Height: 5\' 5"  (1.651 m)   Body mass index is 27.62 kg/m.  No results found.   Independent interpretation of notes and tests performed by another provider:   None  Procedures performed:   None  Pertinent History, Exam, Impression, and Recommendations:   Deborah Mills was seen today for foot pain.  Plantar fasciitis, left Assessment & Plan: Acute on chronic flare of left plantar fasciitis in the setting of increased physical activity (starting [employer]).  Worse with weightbearing, to the point of having difficulties with gait, worst pain with first up in the morning.  Has utilized orthotics for prior flares, has restarted use as of late while she continues [employer]  Examination without significant tightness throughout the gastrocnemius and Achilles complex, there is hallux valgus noted, pain with passive maximal dorsiflexion, tenderness at the calcaneus anteromedially that recreates her stated symptomatology, remainder of examination benign.  I reviewed various treatment strategies, she will start 2-week course of meloxicam then transition to as needed dosing, continue use of orthotics, adjunct silicone heel cup inserts, home exercises, and relative rest/activity shutdown from lower body athletics.  Follow-up in 4 weeks for reevaluation  Orders: -     Meloxicam; Take 1 tablet (15 mg total) by mouth daily.  Dispense: 30 tablet; Refill: 0     Orders & Medications Meds ordered this encounter  Medications   meloxicam (MOBIC) 15 MG tablet     Sig: Take 1 tablet (15 mg total) by mouth daily.    Dispense:  30 tablet    Refill:  0   No orders of the defined types were placed in this encounter.    Return in about 4 weeks (around 05/08/2022) for f/u.     Montel Culver, MD, [facility]   Primary Care Sports Medicine Primary Care and Sports Medicine at [facility]

## 2022-04-10 NOTE — Assessment & Plan Note (Signed)
Acute on chronic flare of left plantar fasciitis in the setting of increased physical activity (starting [employer]).  Worse with weightbearing, to the point of having difficulties with gait, worst pain with first up in the morning.  Has utilized orthotics for prior flares, has restarted use as of late while she continues [employer]  Examination without significant tightness throughout the gastrocnemius and Achilles complex, there is hallux valgus noted, pain with passive maximal dorsiflexion, tenderness at the calcaneus anteromedially that recreates her stated symptomatology, remainder of examination benign.  I reviewed various treatment strategies, she will start 2-week course of meloxicam then transition to as needed dosing, continue use of orthotics, adjunct silicone heel cup inserts, home exercises, and relative rest/activity shutdown from lower body athletics.  Follow-up in 4 weeks for reevaluation

## 2022-04-10 NOTE — Patient Instructions (Signed)
-   Start meloxicam, take daily with food x 2 weeks - After 2 weeks take daily with food as needed - Use silicone heel cup inserts while on your feet - Modify/shutdown lower body athletics until medically cleared - Start home exercises with information provided once symptoms allow - Return for follow-up in 4 weeks - Contact for any questions/concerns between now and then

## 2022-04-15 ENCOUNTER — Other Ambulatory Visit: Payer: Self-pay

## 2022-04-18 ENCOUNTER — Other Ambulatory Visit: Payer: Self-pay

## 2022-04-22 ENCOUNTER — Other Ambulatory Visit: Payer: Self-pay

## 2022-04-22 MED ORDER — VALACYCLOVIR HCL 1 G PO TABS
1000.0000 mg | ORAL_TABLET | Freq: Every day | ORAL | 3 refills | Status: DC
  Filled 2022-04-22: qty 90, 90d supply, fill #0
  Filled 2022-09-01: qty 90, 90d supply, fill #1
  Filled 2023-02-12: qty 90, 90d supply, fill #2

## 2022-05-06 ENCOUNTER — Encounter: Payer: Self-pay | Admitting: Internal Medicine

## 2022-05-06 ENCOUNTER — Ambulatory Visit: Payer: 59 | Admitting: Internal Medicine

## 2022-05-06 VITALS — BP 112/72 | HR 62 | Ht 65.0 in | Wt 168.2 lb

## 2022-05-06 DIAGNOSIS — Z131 Encounter for screening for diabetes mellitus: Secondary | ICD-10-CM

## 2022-05-06 DIAGNOSIS — G43909 Migraine, unspecified, not intractable, without status migrainosus: Secondary | ICD-10-CM

## 2022-05-06 DIAGNOSIS — F064 Anxiety disorder due to known physiological condition: Secondary | ICD-10-CM | POA: Diagnosis not present

## 2022-05-06 DIAGNOSIS — E785 Hyperlipidemia, unspecified: Secondary | ICD-10-CM | POA: Diagnosis not present

## 2022-05-06 DIAGNOSIS — Z1231 Encounter for screening mammogram for malignant neoplasm of breast: Secondary | ICD-10-CM | POA: Diagnosis not present

## 2022-05-06 DIAGNOSIS — K5901 Slow transit constipation: Secondary | ICD-10-CM

## 2022-05-06 DIAGNOSIS — M722 Plantar fascial fibromatosis: Secondary | ICD-10-CM

## 2022-05-06 DIAGNOSIS — L708 Other acne: Secondary | ICD-10-CM | POA: Diagnosis not present

## 2022-05-06 DIAGNOSIS — E559 Vitamin D deficiency, unspecified: Secondary | ICD-10-CM | POA: Diagnosis not present

## 2022-05-06 DIAGNOSIS — Z0000 Encounter for general adult medical examination without abnormal findings: Secondary | ICD-10-CM

## 2022-05-06 NOTE — Assessment & Plan Note (Signed)
Currently diet controlled only

## 2022-05-06 NOTE — Assessment & Plan Note (Signed)
Seeing SM; on Mobic with some benefit May need to follow up for [place] or cortisone injection

## 2022-05-06 NOTE — Assessment & Plan Note (Signed)
Clinically stable on current regimen with good control of symptoms, No SI or HI. No change in management at this time. On Lexapro for anxiety/depression

## 2022-05-06 NOTE — Assessment & Plan Note (Signed)
No recent change in the character of HA - now only rarely Headaches respond well to current therapy with otc medications as needed.. Will continue current plan and follow up if worsening.

## 2022-05-06 NOTE — Assessment & Plan Note (Signed)
Constipation controlled with PRN Linzess

## 2022-05-06 NOTE — Assessment & Plan Note (Signed)
Controlled with topical agents and Spironolactone.

## 2022-05-06 NOTE — Progress Notes (Signed)
Date:  05/06/2022   Name:  Deborah Mills   DOB:  10/07/1975   MRN:  [id number]   Chief Complaint: Labs Only Deborah Mills is a 47 y.o. female who presents today for her Complete Annual Exam. She feels well. She reports exercising walking/running. She reports she is sleeping fairly well. Breast complaints none. She sees GYN for Pap and mammogram this May.  Mammogram: 03/2021 DEXA: none Pap smear: 03/2021 neg/neg Colonoscopy: 2021  Health Maintenance Due  Topic Date Due   COVID-19 Vaccine (4 - 2023-24 season) 09/20/2021   MAMMOGRAM  04/17/2022    Immunization History  Administered Date(s) Administered   Influenza-Unspecified 12/04/2017, 10/21/2018, 10/18/2019, 10/29/2020, 11/12/2021   Moderna Sars-Covid-2 Vaccination 01/12/2019, 02/12/2019, 11/18/2019   Tdap 11/03/2014    Anxiety Presents for follow-up visit. Symptoms include nervous/anxious behavior. Patient reports no chest pain, dizziness, palpitations or shortness of breath. Symptoms occur occasionally. The quality of sleep is good.   Compliance with medications is 76-100%.  Constipation This is a recurrent problem. The problem has been waxing and waning since onset. Pertinent negatives include no abdominal pain, diarrhea, fever or vomiting. Treatments tried: linzess.  Migraine  This is a recurrent problem. The problem occurs seasonly. The pain quality is similar to prior headaches. Pertinent negatives include no abdominal pain, coughing, dizziness, fever, hearing loss, tinnitus or vomiting.  Hyperlipidemia This is a chronic problem. Recent lipid tests were reviewed and are high. Pertinent negatives include no chest pain or shortness of breath. Current antihyperlipidemic treatment includes diet change and exercise.    Lab Results  Component Value Date   NA 139 05/21/2021   K 4.2 05/21/2021   CO2 24 05/21/2021   GLUCOSE 85 05/21/2021   BUN 15 05/21/2021   CREATININE 0.78 05/21/2021   CALCIUM 9.3 05/21/2021   EGFR  94 05/21/2021   GFRNONAA 88 02/14/2019   Lab Results  Component Value Date   CHOL 179 05/21/2021   HDL 64 05/21/2021   LDLCALC 98 05/21/2021   TRIG 92 05/21/2021   CHOLHDL 2.8 05/21/2021   Lab Results  Component Value Date   TSH 1.300 05/21/2021   Lab Results  Component Value Date   HGBA1C 4.9 05/21/2021   Lab Results  Component Value Date   WBC 5.5 05/21/2021   HGB 15.3 05/21/2021   HCT 43.9 05/21/2021   MCV 91 05/21/2021   PLT 176 05/21/2021   Lab Results  Component Value Date   ALT 14 05/21/2021   AST 22 05/21/2021   ALKPHOS 53 05/21/2021   BILITOT 0.5 05/21/2021   Lab Results  Component Value Date   VD25OH 34.1 05/21/2021     Review of Systems  Constitutional:  Negative for chills, fatigue and fever.  HENT:  Negative for congestion, hearing loss, tinnitus, trouble swallowing and voice change.   Eyes:  Negative for visual disturbance.  Respiratory:  Negative for cough, chest tightness, shortness of breath and wheezing.   Cardiovascular:  Negative for chest pain, palpitations and leg swelling.  Gastrointestinal:  Positive for constipation. Negative for abdominal pain, diarrhea and vomiting.  Endocrine: Negative for polydipsia and polyuria.  Genitourinary:  Negative for dysuria, frequency, genital sores, vaginal bleeding and vaginal discharge.  Musculoskeletal:  Positive for arthralgias (plantar fasciitis). Negative for gait problem and joint swelling.  Skin:  Negative for color change and rash.  Neurological:  Negative for dizziness, tremors, light-headedness and headaches.  Hematological:  Negative for adenopathy. Does not bruise/bleed easily.  Psychiatric/Behavioral:  Negative  for dysphoric mood and sleep disturbance. The patient is nervous/anxious.     Patient Active Problem List   Diagnosis Date Noted   Slow transit constipation 05/06/2022   Plantar fasciitis, left 04/10/2022   Vitamin D deficiency 05/02/2021   Other acne 01/27/2018   Hyperlipidemia,  mild 08/29/2015   Anxiety disorder due to known physiological condition 10/27/2014   Awareness of heartbeats 10/27/2014   Degeneration of intervertebral disc of cervical region 10/27/2014   Headache, migraine 10/27/2014    No Known Allergies  Past Surgical History:  Procedure Laterality Date   COLONOSCOPY WITH PROPOFOL N/A 01/12/2020   Procedure: COLONOSCOPY WITH PROPOFOL;  Surgeon: Toney Reil, MD;  Location: [facility];  Service: Endoscopy;  Laterality: N/A;   GANGLION CYST EXCISION     ROTATOR CUFF REPAIR     Uterine ablation  2010    Social History   Tobacco Use   Smoking status: Never   Smokeless tobacco: Never  Vaping Use   Vaping Use: Never used  Substance Use Topics   Alcohol use: No    Alcohol/week: 0.0 standard drinks of alcohol   Drug use: Never     Medication list has been reviewed and updated.  Current Meds  Medication Sig   Adapalene-Benzoyl Peroxide 0.1-2.5 % gel Apply 1 application topically at bedtime.   escitalopram (LEXAPRO) 20 MG tablet Take 20 mg by mouth daily.   ibuprofen (ADVIL) 600 MG tablet Take 1 tablet (600 mg total) by mouth every 6 (six) hours as needed.   linaclotide (LINZESS) 145 MCG CAPS capsule TAKE 1 CAPSULE BY MOUTH  DAILY BEFORE BREAKFAST (Patient taking differently: as needed. TAKE 1 CAPSULE BY MOUTH  DAILY BEFORE BREAKFAST)   meloxicam (MOBIC) 15 MG tablet Take 1 tablet (15 mg total) by mouth daily.   phentermine (ADIPEX-P) 37.5 MG tablet Take 1 tablet every day by oral route in the morning.   spironolactone (ALDACTONE) 100 MG tablet Take 1 tablet (100 mg total) by mouth daily.   valACYclovir (VALTREX) 1000 MG tablet Take 1 tablet (1,000 mg total) by mouth daily.       05/06/2022    8:11 AM 11/20/2021    3:43 PM 05/02/2021    8:02 AM 01/09/2021    2:30 PM  GAD 7 : Generalized Anxiety Score  Nervous, Anxious, on Edge 0 0 0 0  Control/stop worrying 0 0 0 0  Worry too much - different things 0 0 0 1  Trouble  relaxing 0 0 0 0  Restless 0 0 0 0  Easily annoyed or irritable 0 0 0 1  Afraid - awful might happen 0 0 0 0  Total GAD 7 Score 0 0 0 2  Anxiety Difficulty Not difficult at all Not difficult at all Not difficult at all        05/06/2022    8:11 AM 11/20/2021    3:43 PM 05/02/2021    8:02 AM  Depression screen PHQ 2/9  Decreased Interest 0 0 0  Down, Depressed, Hopeless 0 0 0  PHQ - 2 Score 0 0 0  Altered sleeping 0 0 0  Tired, decreased energy 0 3 0  Change in appetite 0 0 0  Feeling bad or failure about yourself  0 0 0  Trouble concentrating 0 0 0  Moving slowly or fidgety/restless 0 0 0  Suicidal thoughts 0 0 0  PHQ-9 Score 0 3 0  Difficult doing work/chores Not difficult at all Not difficult at all  Not difficult at all    BP Readings from Last 3 Encounters:  05/06/22 112/72  04/10/22 120/80  03/11/22 117/81    Physical Exam Vitals and nursing note reviewed.  Constitutional:      General: She is not in acute distress.    Appearance: She is well-developed.  HENT:     Head: Normocephalic and atraumatic.     Right Ear: Tympanic membrane and ear canal normal.     Left Ear: Tympanic membrane and ear canal normal.     Nose:     Right Sinus: No maxillary sinus tenderness.     Left Sinus: No maxillary sinus tenderness.  Eyes:     General: No scleral icterus.       Right eye: No discharge.        Left eye: No discharge.     Conjunctiva/sclera: Conjunctivae normal.  Neck:     Thyroid: No thyromegaly.     Vascular: No carotid bruit.  Cardiovascular:     Rate and Rhythm: Normal rate and regular rhythm.     Pulses: Normal pulses.     Heart sounds: Normal heart sounds.  Pulmonary:     Effort: Pulmonary effort is normal. No respiratory distress.     Breath sounds: No wheezing.  Abdominal:     General: Bowel sounds are normal.     Palpations: Abdomen is soft.     Tenderness: There is no abdominal tenderness.  Musculoskeletal:     Cervical back: Normal range of motion.  No erythema.     Right lower leg: No edema.     Left lower leg: No edema.  Lymphadenopathy:     Cervical: No cervical adenopathy.  Skin:    General: Skin is warm and dry.     Findings: No rash.  Neurological:     Mental Status: She is alert and oriented to person, place, and time.     Cranial Nerves: No cranial nerve deficit.     Sensory: No sensory deficit.     Deep Tendon Reflexes: Reflexes are normal and symmetric.  Psychiatric:        Attention and Perception: Attention normal.        Mood and Affect: Mood normal.     Wt Readings from Last 3 Encounters:  05/06/22 168 lb 3.2 oz (76.3 kg)  11/20/21 166 lb (75.3 kg)  05/02/21 166 lb 9.6 oz (75.6 kg)    BP 112/72   Pulse 62   Ht 5\' 5"  (1.651 m)   Wt 168 lb 3.2 oz (76.3 kg)   SpO2 99%   BMI 27.99 kg/m   Assessment and Plan:  Problem List Items Addressed This Visit       Cardiovascular and Mediastinum   Headache, migraine (Chronic)    No recent change in the character of HA - now only rarely Headaches respond well to current therapy with otc medications as needed.. Will continue current plan and follow up if worsening.         Digestive   Slow transit constipation    Constipation controlled with PRN Linzess      Relevant Orders   CBC with Differential/Platelet   Comprehensive metabolic panel     Nervous and Auditory   Anxiety disorder due to known physiological condition (Chronic)    Clinically stable on current regimen with good control of symptoms, No SI or HI. No change in management at this time. On Lexapro for anxiety/depression      Relevant  Orders   TSH     Musculoskeletal and Integument   Other acne    Controlled with topical agents and Spironolactone.      Plantar fasciitis, left    Seeing SM; on Mobic with some benefit May need to follow up for [place] or cortisone injection        Other   Vitamin D deficiency   Relevant Orders   VITAMIN D 25 Hydroxy (Vit-D Deficiency, Fractures)    Hyperlipidemia, mild (Chronic)    Currently diet controlled only      Relevant Orders   Lipid panel   Other Visit Diagnoses     Annual physical exam    -  Primary   Normal exam continue healthy diet, exercise as able   Relevant Orders   CBC with Differential/Platelet   Comprehensive metabolic panel   Lipid panel   TSH   VITAMIN D 25 Hydroxy (Vit-D Deficiency, Fractures)   Hemoglobin A1c   Encounter for screening mammogram for breast cancer       Done by GYN   Screening for diabetes mellitus       Relevant Orders   Hemoglobin A1c       No follow-ups on file.   Partially dictated using Dragon software, any errors are not intentional.  Reubin Milan, MD [facility] Primary [facility] Sports Medicine [place], [place]

## 2022-05-08 ENCOUNTER — Other Ambulatory Visit: Payer: Self-pay

## 2022-05-08 ENCOUNTER — Encounter: Payer: Self-pay | Admitting: Family Medicine

## 2022-05-08 ENCOUNTER — Ambulatory Visit (INDEPENDENT_AMBULATORY_CARE_PROVIDER_SITE_OTHER): Payer: 59 | Admitting: Family Medicine

## 2022-05-08 DIAGNOSIS — M722 Plantar fascial fibromatosis: Secondary | ICD-10-CM | POA: Diagnosis not present

## 2022-05-08 MED ORDER — MELOXICAM 15 MG PO TABS
15.0000 mg | ORAL_TABLET | Freq: Every day | ORAL | 1 refills | Status: DC | PRN
  Filled 2022-05-08: qty 30, 30d supply, fill #0
  Filled 2022-10-06: qty 30, 30d supply, fill #1

## 2022-05-11 ENCOUNTER — Other Ambulatory Visit: Payer: Self-pay

## 2022-05-11 NOTE — Progress Notes (Signed)
     Primary Care / Sports Medicine Office Visit  Patient Information:  Patient ID: ARIONA DESCHENE, female DOB: 1975-02-02 Age: 47 y.o. MRN: [id number]   REGINE CHRISTIAN is a pleasant 47 y.o. female presenting with the following:  Chief Complaint  Patient presents with   Plantar fasciitis, left    Better, still aches    Vitals:   05/08/22 1549  BP: 112/78  Pulse: 62  SpO2: 98%   Vitals:   05/08/22 1549  Weight: 168 lb (76.2 kg)  Height:  (1.651 m)   Body mass index is 27.96 kg/m.  No results found.   Independent interpretation of notes and tests performed by another provider:   None  Procedures performed:   None  Pertinent History, Exam, Impression, and Recommendations:   Quanasia was seen today for plantar fasciitis, left.  Plantar fasciitis, left Assessment & Plan: Chronic with ongoing symptoms, noting steady improvement. Compliant with treatments.  - Offered cortisone, patient deferred as she wants to give more time with current measures given interim progress - Transition to PRN meloxicam - Encouraged flexibility throughout PF and posterior chain - Can follow-up as-needed  Orders: -     Meloxicam; Take 1 tablet (15 mg total) by mouth daily as needed for pain.  Dispense: 30 tablet; Refill: 1     Orders & Medications Meds ordered this encounter  Medications   meloxicam (MOBIC) 15 MG tablet    Sig: Take 1 tablet (15 mg total) by mouth daily as needed for pain.    Dispense:  30 tablet    Refill:  1   No orders of the defined types were placed in this encounter.    No follow-ups on file.     Jerrol Banana, MD, [facility]   Primary Care Sports Medicine Primary Care and Sports Medicine at [facility]

## 2022-05-11 NOTE — Assessment & Plan Note (Addendum)
Chronic with ongoing symptoms, noting steady improvement. Compliant with treatments.  - Offered cortisone, patient deferred as she wants to give more time with current measures given interim progress - Transition to PRN meloxicam - Encouraged flexibility throughout PF and posterior chain - Can follow-up as-needed

## 2022-05-12 ENCOUNTER — Other Ambulatory Visit: Payer: Self-pay

## 2022-05-13 ENCOUNTER — Other Ambulatory Visit: Payer: Self-pay

## 2022-05-13 ENCOUNTER — Encounter: Payer: Self-pay | Admitting: Internal Medicine

## 2022-05-15 ENCOUNTER — Other Ambulatory Visit: Payer: Self-pay

## 2022-05-16 ENCOUNTER — Other Ambulatory Visit: Payer: Self-pay

## 2022-05-19 DIAGNOSIS — E785 Hyperlipidemia, unspecified: Secondary | ICD-10-CM | POA: Diagnosis not present

## 2022-05-19 DIAGNOSIS — Z0000 Encounter for general adult medical examination without abnormal findings: Secondary | ICD-10-CM | POA: Diagnosis not present

## 2022-05-19 DIAGNOSIS — K5901 Slow transit constipation: Secondary | ICD-10-CM | POA: Diagnosis not present

## 2022-05-19 DIAGNOSIS — E559 Vitamin D deficiency, unspecified: Secondary | ICD-10-CM | POA: Diagnosis not present

## 2022-05-19 DIAGNOSIS — F064 Anxiety disorder due to known physiological condition: Secondary | ICD-10-CM | POA: Diagnosis not present

## 2022-05-19 DIAGNOSIS — Z131 Encounter for screening for diabetes mellitus: Secondary | ICD-10-CM | POA: Diagnosis not present

## 2022-05-20 LAB — COMPREHENSIVE METABOLIC PANEL
ALT: 17 IU/L (ref 0–32)
AST: 24 IU/L (ref 0–40)
Albumin/Globulin Ratio: 2.2 (ref 1.2–2.2)
Albumin: 4.3 g/dL (ref 3.9–4.9)
Alkaline Phosphatase: 56 IU/L (ref 44–121)
BUN/Creatinine Ratio: 14 (ref 9–23)
BUN: 12 mg/dL (ref 6–24)
Bilirubin Total: 0.3 mg/dL (ref 0.0–1.2)
CO2: 22 mmol/L (ref 20–29)
Calcium: 9.3 mg/dL (ref 8.7–10.2)
Chloride: 104 mmol/L (ref 96–106)
Creatinine, Ser: 0.83 mg/dL (ref 0.57–1.00)
Globulin, Total: 2 g/dL (ref 1.5–4.5)
Glucose: 79 mg/dL (ref 70–99)
Potassium: 4.2 mmol/L (ref 3.5–5.2)
Sodium: 139 mmol/L (ref 134–144)
Total Protein: 6.3 g/dL (ref 6.0–8.5)
eGFR: 87 mL/min/{1.73_m2} (ref >59)

## 2022-05-20 LAB — LIPID PANEL
Chol/HDL Ratio: 2.7 ratio (ref 0.0–4.4)
Cholesterol, Total: 197 mg/dL (ref 100–199)
HDL: 72 mg/dL (ref >39)
LDL Chol Calc (NIH): 104 mg/dL — ABNORMAL HIGH (ref 0–99)
Triglycerides: 118 mg/dL (ref 0–149)
VLDL Cholesterol Cal: 21 mg/dL (ref 5–40)

## 2022-05-20 LAB — CBC WITH DIFFERENTIAL/PLATELET
Basophils Absolute: 0.1 10*3/uL (ref 0.0–0.2)
Basos: 1 %
EOS (ABSOLUTE): 0.2 10*3/uL (ref 0.0–0.4)
Eos: 3 %
Hematocrit: 44.2 % (ref 34.0–46.6)
Hemoglobin: 15.4 g/dL (ref 11.1–15.9)
Immature Grans (Abs): 0 10*3/uL (ref 0.0–0.1)
Immature Granulocytes: 0 %
Lymphocytes Absolute: 2.9 10*3/uL (ref 0.7–3.1)
Lymphs: 39 %
MCH: 32 pg (ref 26.6–33.0)
MCHC: 34.8 g/dL (ref 31.5–35.7)
MCV: 92 fL (ref 79–97)
Monocytes Absolute: 0.6 10*3/uL (ref 0.1–0.9)
Monocytes: 8 %
Neutrophils Absolute: 3.7 10*3/uL (ref 1.4–7.0)
Neutrophils: 49 %
Platelets: 197 10*3/uL (ref 150–450)
RBC: 4.81 x10E6/uL (ref 3.77–5.28)
RDW: 11.9 % (ref 11.7–15.4)
WBC: 7.4 10*3/uL (ref 3.4–10.8)

## 2022-05-20 LAB — HEMOGLOBIN A1C
Est. average glucose Bld gHb Est-mCnc: 97 mg/dL
Hgb A1c MFr Bld: 5 % (ref 4.8–5.6)

## 2022-05-20 LAB — VITAMIN D 25 HYDROXY (VIT D DEFICIENCY, FRACTURES): Vit D, 25-Hydroxy: 34.5 ng/mL (ref 30.0–100.0)

## 2022-05-20 LAB — TSH: TSH: 1.62 u[IU]/mL (ref 0.450–4.500)

## 2022-05-23 ENCOUNTER — Other Ambulatory Visit: Payer: Self-pay

## 2022-06-02 ENCOUNTER — Other Ambulatory Visit: Payer: Self-pay

## 2022-06-02 DIAGNOSIS — R319 Hematuria, unspecified: Secondary | ICD-10-CM | POA: Diagnosis not present

## 2022-06-02 DIAGNOSIS — Z01419 Encounter for gynecological examination (general) (routine) without abnormal findings: Secondary | ICD-10-CM | POA: Diagnosis not present

## 2022-06-02 DIAGNOSIS — Z6829 Body mass index (BMI) 29.0-29.9, adult: Secondary | ICD-10-CM | POA: Diagnosis not present

## 2022-06-02 DIAGNOSIS — Z1231 Encounter for screening mammogram for malignant neoplasm of breast: Secondary | ICD-10-CM | POA: Diagnosis not present

## 2022-06-02 MED ORDER — ESCITALOPRAM OXALATE 20 MG PO TABS
20.0000 mg | ORAL_TABLET | Freq: Every day | ORAL | 3 refills | Status: DC
  Filled 2022-06-02 (×2): qty 30, 30d supply, fill #0
  Filled 2022-06-02: qty 60, 60d supply, fill #0
  Filled 2022-09-01: qty 90, 90d supply, fill #0
  Filled 2023-02-12: qty 90, 90d supply, fill #1

## 2022-06-02 MED ORDER — PHENTERMINE HCL 37.5 MG PO TABS
37.5000 mg | ORAL_TABLET | Freq: Every morning | ORAL | 2 refills | Status: DC
  Filled 2022-06-02: qty 30, 30d supply, fill #0
  Filled 2022-06-30: qty 30, 30d supply, fill #1
  Filled 2022-09-01: qty 30, 30d supply, fill #2

## 2022-06-02 MED ORDER — VALACYCLOVIR HCL 1 G PO TABS
1000.0000 mg | ORAL_TABLET | Freq: Every day | ORAL | 3 refills | Status: DC
  Filled 2022-06-02: qty 90, 90d supply, fill #0

## 2022-07-01 ENCOUNTER — Other Ambulatory Visit: Payer: Self-pay

## 2022-08-07 DIAGNOSIS — Z6828 Body mass index (BMI) 28.0-28.9, adult: Secondary | ICD-10-CM | POA: Diagnosis not present

## 2022-08-07 DIAGNOSIS — Z713 Dietary counseling and surveillance: Secondary | ICD-10-CM | POA: Diagnosis not present

## 2022-09-01 ENCOUNTER — Other Ambulatory Visit: Payer: Self-pay

## 2022-09-02 ENCOUNTER — Other Ambulatory Visit: Payer: Self-pay

## 2022-09-19 DIAGNOSIS — H5201 Hypermetropia, right eye: Secondary | ICD-10-CM | POA: Diagnosis not present

## 2022-09-19 DIAGNOSIS — H5212 Myopia, left eye: Secondary | ICD-10-CM | POA: Diagnosis not present

## 2022-10-06 ENCOUNTER — Other Ambulatory Visit: Payer: Self-pay

## 2022-10-07 ENCOUNTER — Other Ambulatory Visit: Payer: Self-pay

## 2022-10-07 MED ORDER — PHENTERMINE HCL 37.5 MG PO TABS
37.5000 mg | ORAL_TABLET | Freq: Every morning | ORAL | 2 refills | Status: DC
  Filled 2022-10-07: qty 30, 30d supply, fill #0
  Filled 2022-11-28: qty 30, 30d supply, fill #1
  Filled 2023-02-12: qty 30, 30d supply, fill #2

## 2022-12-04 ENCOUNTER — Other Ambulatory Visit: Payer: Self-pay

## 2023-02-12 ENCOUNTER — Other Ambulatory Visit: Payer: Self-pay

## 2023-02-13 ENCOUNTER — Other Ambulatory Visit: Payer: Self-pay

## 2023-03-17 ENCOUNTER — Encounter: Payer: Self-pay | Admitting: Dermatology

## 2023-03-18 ENCOUNTER — Encounter: Payer: Self-pay | Admitting: Dermatology

## 2023-06-29 ENCOUNTER — Other Ambulatory Visit: Payer: Self-pay

## 2023-06-29 DIAGNOSIS — Z01419 Encounter for gynecological examination (general) (routine) without abnormal findings: Secondary | ICD-10-CM | POA: Diagnosis not present

## 2023-06-29 DIAGNOSIS — Z130 Encounter for screening for diseases of the blood and blood-forming organs and certain disorders involving the immune mechanism: Secondary | ICD-10-CM | POA: Diagnosis not present

## 2023-06-29 DIAGNOSIS — Z1329 Encounter for screening for other suspected endocrine disorder: Secondary | ICD-10-CM | POA: Diagnosis not present

## 2023-06-29 DIAGNOSIS — Z131 Encounter for screening for diabetes mellitus: Secondary | ICD-10-CM | POA: Diagnosis not present

## 2023-06-29 DIAGNOSIS — Z1231 Encounter for screening mammogram for malignant neoplasm of breast: Secondary | ICD-10-CM | POA: Diagnosis not present

## 2023-06-29 DIAGNOSIS — Z1321 Encounter for screening for nutritional disorder: Secondary | ICD-10-CM | POA: Diagnosis not present

## 2023-06-29 DIAGNOSIS — Z124 Encounter for screening for malignant neoplasm of cervix: Secondary | ICD-10-CM | POA: Diagnosis not present

## 2023-06-29 DIAGNOSIS — F411 Generalized anxiety disorder: Secondary | ICD-10-CM | POA: Diagnosis not present

## 2023-06-29 DIAGNOSIS — Z13228 Encounter for screening for other metabolic disorders: Secondary | ICD-10-CM | POA: Diagnosis not present

## 2023-06-29 DIAGNOSIS — Z6827 Body mass index (BMI) 27.0-27.9, adult: Secondary | ICD-10-CM | POA: Diagnosis not present

## 2023-06-29 LAB — LIPID PANEL
Cholesterol: 179 (ref 0–200)
HDL: 58 (ref 35–70)
LDL Cholesterol: 105
Triglycerides: 86 (ref 40–160)

## 2023-06-29 LAB — HEPATIC FUNCTION PANEL
ALT: 10 U/L (ref 7–35)
AST: 21 (ref 13–35)
Alkaline Phosphatase: 54 (ref 25–125)
Bilirubin, Total: 0.5

## 2023-06-29 LAB — COMPREHENSIVE METABOLIC PANEL WITH GFR
Calcium: 9.4 (ref 8.7–10.7)
eGFR: 92

## 2023-06-29 LAB — CBC AND DIFFERENTIAL
HCT: 43 (ref 36–46)
Hemoglobin: 14.5 (ref 12.0–16.0)
Platelets: 185 K/uL (ref 150–400)
WBC: 6

## 2023-06-29 LAB — BASIC METABOLIC PANEL WITH GFR
BUN: 10 (ref 4–21)
CO2: 21 (ref 13–22)
Chloride: 102 (ref 99–108)
Creatinine: 0.8 (ref 0.5–1.1)
Glucose: 72
Potassium: 4 meq/L (ref 3.5–5.1)
Sodium: 137 (ref 137–147)

## 2023-06-29 LAB — VITAMIN D 25 HYDROXY (VIT D DEFICIENCY, FRACTURES): Vit D, 25-Hydroxy: 33

## 2023-06-29 LAB — HEMOGLOBIN A1C: Hemoglobin A1C: 4.7

## 2023-06-29 LAB — HM MAMMOGRAPHY

## 2023-06-29 LAB — TSH: TSH: 0.85 (ref 0.41–5.90)

## 2023-06-29 MED ORDER — ESCITALOPRAM OXALATE 20 MG PO TABS
20.0000 mg | ORAL_TABLET | Freq: Every day | ORAL | 3 refills | Status: AC
  Filled 2023-06-29 – 2023-08-09 (×2): qty 90, 90d supply, fill #0
  Filled 2023-10-29: qty 90, 90d supply, fill #1

## 2023-06-30 LAB — LAB REPORT - SCANNED
A1c: 4.7
EGFR: 92
Free T4: 1.06 ng/dL
TSH: 0.856

## 2023-07-01 LAB — HM PAP SMEAR: HM Pap smear: NORMAL

## 2023-07-09 ENCOUNTER — Other Ambulatory Visit: Payer: Self-pay

## 2023-08-09 ENCOUNTER — Other Ambulatory Visit: Payer: Self-pay

## 2023-08-09 ENCOUNTER — Other Ambulatory Visit: Payer: Self-pay | Admitting: Dermatology

## 2023-08-09 DIAGNOSIS — L700 Acne vulgaris: Secondary | ICD-10-CM

## 2023-08-09 MED ORDER — VALACYCLOVIR HCL 1 G PO TABS
1000.0000 mg | ORAL_TABLET | Freq: Every day | ORAL | 2 refills | Status: AC
  Filled 2023-08-09: qty 90, 90d supply, fill #0
  Filled 2023-11-10: qty 90, 90d supply, fill #1

## 2023-08-10 ENCOUNTER — Other Ambulatory Visit: Payer: Self-pay

## 2023-08-10 ENCOUNTER — Other Ambulatory Visit: Payer: Self-pay | Admitting: Dermatology

## 2023-08-10 DIAGNOSIS — L700 Acne vulgaris: Secondary | ICD-10-CM

## 2023-09-16 ENCOUNTER — Other Ambulatory Visit: Payer: Self-pay

## 2023-09-16 ENCOUNTER — Ambulatory Visit (INDEPENDENT_AMBULATORY_CARE_PROVIDER_SITE_OTHER): Payer: Self-pay | Admitting: Internal Medicine

## 2023-09-16 ENCOUNTER — Ambulatory Visit
Admission: RE | Admit: 2023-09-16 | Discharge: 2023-09-16 | Disposition: A | Discharge status: Home / Self Care | Attending: Internal Medicine | Admitting: Internal Medicine

## 2023-09-16 ENCOUNTER — Encounter: Payer: Self-pay | Admitting: Internal Medicine

## 2023-09-16 ENCOUNTER — Ambulatory Visit
Admission: RE | Admit: 2023-09-16 | Discharge: 2023-09-16 | Disposition: A | Discharge status: Home / Self Care | Source: Ambulatory Visit | Attending: Internal Medicine | Admitting: Internal Medicine

## 2023-09-16 VITALS — BP 110/74 | HR 83 | Ht 65.0 in | Wt 153.5 lb

## 2023-09-16 DIAGNOSIS — M25552 Pain in left hip: Secondary | ICD-10-CM | POA: Diagnosis not present

## 2023-09-16 DIAGNOSIS — G8929 Other chronic pain: Secondary | ICD-10-CM | POA: Diagnosis not present

## 2023-09-16 MED ORDER — MELOXICAM 15 MG PO TABS
15.0000 mg | ORAL_TABLET | Freq: Every day | ORAL | 0 refills | Status: AC | PRN
  Filled 2023-09-16: qty 30, 30d supply, fill #0

## 2023-09-16 NOTE — Progress Notes (Deleted)
 Date:  09/16/2023   Name:  Deborah Mills   DOB:  20-Jun-1975   MRN:  [id number]   Chief Complaint: No chief complaint on file. Deborah Mills is a 48 y.o. female who presents today for her Complete Annual Exam. She feels {DESC; WELL/FAIRLY WELL/POORLY:18703}. She reports exercising ***. She reports she is sleeping {DESC; WELL/FAIRLY WELL/POORLY:18703}. Breast complaints ***.  Health Maintenance  Topic Date Due   Hepatitis B Vaccine (1 of 3 - 19+ 3-dose series) Never done   Mammogram  04/17/2022   COVID-19 Vaccine (4 - 2024-25 season) 09/21/2022   Pneumococcal Vaccine for age over 45 (1 of 1 - PCV) Never done   Zoster (Shingles) Vaccine (1 of 2) Never done   Flu Shot  08/21/2023   Pap with HPV screening  04/17/2024   DTaP/Tdap/Td vaccine (2 - Td or Tdap) 11/02/2024   Colon Cancer Screening  01/11/2030   Hepatitis C Screening  Completed   HIV Screening  Completed   HPV Vaccine  Aged Out   Meningitis B Vaccine  Aged Out    HPI  Review of Systems  Constitutional:  Negative for fatigue and unexpected weight change.  HENT:  Negative for trouble swallowing.   Eyes:  Negative for visual disturbance.  Respiratory:  Negative for cough, chest tightness, shortness of breath and wheezing.   Cardiovascular:  Negative for chest pain, palpitations and leg swelling.  Gastrointestinal:  Negative for abdominal pain, constipation and diarrhea.  Musculoskeletal:  Negative for arthralgias and myalgias.  Neurological:  Negative for dizziness, weakness, light-headedness and headaches.     Lab Results  Component Value Date   NA 139 05/19/2022   K 4.2 05/19/2022   CO2 22 05/19/2022   GLUCOSE 79 05/19/2022   BUN 12 05/19/2022   CREATININE 0.83 05/19/2022   CALCIUM  9.3 05/19/2022   EGFR 87 05/19/2022   GFRNONAA 88 02/14/2019   Lab Results  Component Value Date   CHOL 197 05/19/2022   HDL 72 05/19/2022   LDLCALC 104 (H) 05/19/2022   TRIG 118 05/19/2022   CHOLHDL 2.7 05/19/2022    Lab Results  Component Value Date   TSH 1.620 05/19/2022   Lab Results  Component Value Date   HGBA1C 5.0 05/19/2022   Lab Results  Component Value Date   WBC 7.4 05/19/2022   HGB 15.4 05/19/2022   HCT 44.2 05/19/2022   MCV 92 05/19/2022   PLT 197 05/19/2022   Lab Results  Component Value Date   ALT 17 05/19/2022   AST 24 05/19/2022   ALKPHOS 56 05/19/2022   BILITOT 0.3 05/19/2022   Lab Results  Component Value Date   VD25OH 34.5 05/19/2022     Patient Active Problem List   Diagnosis Date Noted   Slow transit constipation 05/06/2022   Plantar fasciitis, left 04/10/2022   Vitamin D  deficiency 05/02/2021   Other acne 01/27/2018   Hyperlipidemia, mild 08/29/2015   Anxiety disorder due to known physiological condition 10/27/2014   Awareness of heartbeats 10/27/2014   Degeneration of intervertebral disc of cervical region 10/27/2014   Headache, migraine 10/27/2014    No Known Allergies  Past Surgical History:  Procedure Laterality Date   COLONOSCOPY WITH PROPOFOL  N/A 01/12/2020   Procedure: COLONOSCOPY WITH PROPOFOL ;  Surgeon: Unk Corinn Skiff, MD;  Location: [facility];  Service: Endoscopy;  Laterality: N/A;   GANGLION CYST EXCISION     ROTATOR CUFF REPAIR     Uterine ablation  2010  Social History   Tobacco Use   Smoking status: Never   Smokeless tobacco: Never  Vaping Use   Vaping status: Never Used  Substance Use Topics   Alcohol use: No    Alcohol/week: 0.0 standard drinks of alcohol   Drug use: Never     Medication list has been reviewed and updated.  No outpatient medications have been marked as taking for the 09/16/23 encounter (Appointment) with Mills Deborah DEL, MD.       05/06/2022    8:11 AM 11/20/2021    3:43 PM 05/02/2021    8:02 AM 01/09/2021    2:30 PM  GAD 7 : Generalized Anxiety Score  Nervous, Anxious, on Edge 0 0 0 0  Control/stop worrying 0 0 0 0  Worry too much - different things 0 0 0 1  Trouble relaxing  0 0 0 0  Restless 0 0 0 0  Easily annoyed or irritable 0 0 0 1  Afraid - awful might happen 0 0 0 0  Total GAD 7 Score 0 0 0 2  Anxiety Difficulty Not difficult at all Not difficult at all Not difficult at all        05/06/2022    8:11 AM 11/20/2021    3:43 PM 05/02/2021    8:02 AM  Depression screen PHQ 2/9  Decreased Interest 0 0 0  Down, Depressed, Hopeless 0 0 0  PHQ - 2 Score 0 0 0  Altered sleeping 0 0 0  Tired, decreased energy 0 3 0  Change in appetite 0 0 0  Feeling bad or failure about yourself  0 0 0  Trouble concentrating 0 0 0  Moving slowly or fidgety/restless 0 0 0  Suicidal thoughts 0 0 0  PHQ-9 Score 0 3 0  Difficult doing work/chores Not difficult at all Not difficult at all Not difficult at all    BP Readings from Last 3 Encounters:  05/08/22 112/78  05/06/22 112/72  04/10/22 120/80    Physical Exam Vitals and nursing note reviewed.  Constitutional:      General: She is not in acute distress.    Appearance: She is well-developed.  HENT:     Head: Normocephalic and atraumatic.  Pulmonary:     Effort: Pulmonary effort is normal. No respiratory distress.  Skin:    General: Skin is warm and dry.     Findings: No rash.  Neurological:     Mental Status: She is alert and oriented to person, place, and time.  Psychiatric:        Mood and Affect: Mood normal.        Behavior: Behavior normal.     Wt Readings from Last 3 Encounters:  05/08/22 168 lb (76.2 kg)  05/06/22 168 lb 3.2 oz (76.3 kg)  11/20/21 166 lb (75.3 kg)    There were no vitals taken for this visit.  Assessment and Plan:  Problem List Items Addressed This Visit   None   No follow-ups on file.    Deborah HILARIO Justus, MD [facility] Primary Care and Sports Medicine [place]

## 2023-09-16 NOTE — Progress Notes (Signed)
 Date:  09/16/2023   Name:  Deborah Mills   DOB:  10/11/1975   MRN:  [id number]   Chief Complaint: Hip Pain and Joint Pain  Hip Pain  There was no injury mechanism. The pain is present in the left hip. The quality of the pain is described as aching. The pain is moderate. The pain has been Fluctuating since onset. Pertinent negatives include no numbness or tingling. The symptoms are aggravated by palpation and weight bearing. She has tried NSAIDs for the symptoms. The treatment provided mild relief.    Review of Systems  Constitutional:  Negative for chills, fatigue and fever.  Respiratory:  Negative for chest tightness and shortness of breath.   Cardiovascular:  Negative for chest pain and palpitations.  Musculoskeletal:  Positive for arthralgias. Negative for joint swelling and myalgias.  Neurological:  Negative for dizziness, tingling and numbness.     Lab Results  Component Value Date   NA 137 06/29/2023   K 4.0 06/29/2023   CO2 21 06/29/2023   GLUCOSE 79 05/19/2022   BUN 10 06/29/2023   CREATININE 0.8 06/29/2023   CALCIUM  9.4 06/29/2023   EGFR 92 06/29/2023   GFRNONAA 88 02/14/2019   Lab Results  Component Value Date   CHOL 179 06/29/2023   HDL 58 06/29/2023   LDLCALC 105 06/29/2023   TRIG 86 06/29/2023   CHOLHDL 2.7 05/19/2022   Lab Results  Component Value Date   TSH 0.85 06/29/2023   Lab Results  Component Value Date   HGBA1C 4.7 06/29/2023   Lab Results  Component Value Date   WBC 6.0 06/29/2023   HGB 14.5 06/29/2023   HCT 43 06/29/2023   MCV 92 05/19/2022   PLT 185 06/29/2023   Lab Results  Component Value Date   ALT 10 06/29/2023   AST 21 06/29/2023   ALKPHOS 54 06/29/2023   BILITOT 0.3 05/19/2022   Lab Results  Component Value Date   VD25OH 33 06/29/2023     Patient Active Problem List   Diagnosis Date Noted   Slow transit constipation 05/06/2022   Plantar fasciitis, left 04/10/2022   Vitamin D  deficiency 05/02/2021   Other acne  01/27/2018   Hyperlipidemia, mild 08/29/2015   Anxiety disorder due to known physiological condition 10/27/2014   Awareness of heartbeats 10/27/2014   Degeneration of intervertebral disc of cervical region 10/27/2014   Headache, migraine 10/27/2014    No Known Allergies  Past Surgical History:  Procedure Laterality Date   COLONOSCOPY WITH PROPOFOL  N/A 01/12/2020   Procedure: COLONOSCOPY WITH PROPOFOL ;  Surgeon: Unk Corinn Skiff, MD;  Location: [facility];  Service: Endoscopy;  Laterality: N/A;   GANGLION CYST EXCISION     ROTATOR CUFF REPAIR     Uterine ablation  2010    Social History   Tobacco Use   Smoking status: Never   Smokeless tobacco: Never  Vaping Use   Vaping status: Never Used  Substance Use Topics   Alcohol use: No    Alcohol/week: 0.0 standard drinks of alcohol   Drug use: Never     Medication list has been reviewed and updated.  Current Meds  Medication Sig   escitalopram  (LEXAPRO ) 20 MG tablet Take 1 tablet (20 mg total) by mouth daily.   linaclotide  (LINZESS ) 145 MCG CAPS capsule TAKE 1 CAPSULE BY MOUTH  DAILY BEFORE BREAKFAST (Patient taking differently: as needed. TAKE 1 CAPSULE BY MOUTH  DAILY BEFORE BREAKFAST)   spironolactone  (ALDACTONE ) 100 MG tablet  Take 1 tablet (100 mg total) by mouth daily.   valACYclovir  (VALTREX ) 1000 MG tablet Take 1 tablet (1,000 mg total) by mouth daily.       09/16/2023    3:47 PM 05/06/2022    8:11 AM 11/20/2021    3:43 PM 05/02/2021    8:02 AM  GAD 7 : Generalized Anxiety Score  Nervous, Anxious, on Edge 0 0 0 0  Control/stop worrying 0 0 0 0  Worry too much - different things 0 0 0 0  Trouble relaxing 0 0 0 0  Restless 0 0 0 0  Easily annoyed or irritable 0 0 0 0  Afraid - awful might happen 0 0 0 0  Total GAD 7 Score 0 0 0 0  Anxiety Difficulty Not difficult at all Not difficult at all Not difficult at all Not difficult at all       09/16/2023    3:47 PM 05/06/2022    8:11 AM 11/20/2021    3:43  PM  Depression screen PHQ 2/9  Decreased Interest 0 0 0  Down, Depressed, Hopeless 0 0 0  PHQ - 2 Score 0 0 0  Altered sleeping 0 0 0  Tired, decreased energy 0 0 3  Change in appetite 0 0 0  Feeling bad or failure about yourself  0 0 0  Trouble concentrating 0 0 0  Moving slowly or fidgety/restless 0 0 0  Suicidal thoughts 0 0 0  PHQ-9 Score 0 0 3  Difficult doing work/chores Not difficult at all Not difficult at all Not difficult at all    BP Readings from Last 3 Encounters:  09/16/23 110/74  05/08/22 112/78  05/06/22 112/72    Physical Exam Vitals and nursing note reviewed.  Constitutional:      General: She is not in acute distress.    Appearance: Normal appearance. She is well-developed.  HENT:     Head: Normocephalic and atraumatic.  Cardiovascular:     Rate and Rhythm: Normal rate and regular rhythm.     Heart sounds: No murmur heard. Pulmonary:     Effort: Pulmonary effort is normal. No respiratory distress.     Breath sounds: No wheezing or rhonchi.  Musculoskeletal:     Cervical back: Normal range of motion.     Right hip: Normal. No tenderness or bony tenderness. Normal range of motion.     Left hip: Bony tenderness present. No crepitus. Decreased range of motion.  Lymphadenopathy:     Cervical: No cervical adenopathy.  Skin:    General: Skin is warm and dry.     Findings: No rash.  Neurological:     Mental Status: She is alert and oriented to person, place, and time.     Sensory: Sensation is intact.     Motor: Motor function is intact.     Coordination: Coordination is intact.     Gait: Gait is intact.     Deep Tendon Reflexes:     Reflex Scores:      Patellar reflexes are 3+ on the right side and 3+ on the left side.      Achilles reflexes are 3+ on the right side and 3+ on the left side. Psychiatric:        Mood and Affect: Mood normal.        Behavior: Behavior normal.     Wt Readings from Last 3 Encounters:  09/16/23 153 lb 8 oz (69.6 kg)   05/08/22 168 lb (76.2  kg)  05/06/22 168 lb 3.2 oz (76.3 kg)    BP 110/74   Pulse 83   Ht 5' 5 (1.651 m)   Wt 153 lb 8 oz (69.6 kg)   SpO2 98%   BMI 25.54 kg/m   Assessment and Plan:  Problem List Items Addressed This Visit   None Visit Diagnoses       Chronic left hip pain    -  Primary   Bursitis vs OA recommend Mobic  daily for 2 weeks will advise after imaging   Relevant Medications   meloxicam  (MOBIC ) 15 MG tablet   Other Relevant Orders   DG Hip Unilat W OR W/O Pelvis 2-3 Views Left       No follow-ups on file.    Leita HILARIO Adie, MD [facility] Primary [facility] and Sports Medicine [place]

## 2023-09-28 ENCOUNTER — Ambulatory Visit: Payer: Self-pay | Admitting: Internal Medicine

## 2023-09-28 NOTE — Telephone Encounter (Signed)
 Please review.  KP

## 2023-10-29 ENCOUNTER — Other Ambulatory Visit: Payer: Self-pay

## 2023-11-11 ENCOUNTER — Other Ambulatory Visit: Payer: Self-pay

## 2023-12-03 ENCOUNTER — Ambulatory Visit: Admitting: Dermatology

## 2023-12-03 ENCOUNTER — Other Ambulatory Visit: Payer: Self-pay

## 2023-12-03 DIAGNOSIS — L700 Acne vulgaris: Secondary | ICD-10-CM

## 2023-12-03 DIAGNOSIS — Z86018 Personal history of other benign neoplasm: Secondary | ICD-10-CM | POA: Diagnosis not present

## 2023-12-03 DIAGNOSIS — I781 Nevus, non-neoplastic: Secondary | ICD-10-CM

## 2023-12-03 DIAGNOSIS — L814 Other melanin hyperpigmentation: Secondary | ICD-10-CM | POA: Diagnosis not present

## 2023-12-03 DIAGNOSIS — L821 Other seborrheic keratosis: Secondary | ICD-10-CM | POA: Diagnosis not present

## 2023-12-03 DIAGNOSIS — Z1283 Encounter for screening for malignant neoplasm of skin: Secondary | ICD-10-CM | POA: Diagnosis not present

## 2023-12-03 DIAGNOSIS — Z79899 Other long term (current) drug therapy: Secondary | ICD-10-CM

## 2023-12-03 DIAGNOSIS — W908XXA Exposure to other nonionizing radiation, initial encounter: Secondary | ICD-10-CM

## 2023-12-03 DIAGNOSIS — D1801 Hemangioma of skin and subcutaneous tissue: Secondary | ICD-10-CM

## 2023-12-03 DIAGNOSIS — L578 Other skin changes due to chronic exposure to nonionizing radiation: Secondary | ICD-10-CM | POA: Diagnosis not present

## 2023-12-03 DIAGNOSIS — D229 Melanocytic nevi, unspecified: Secondary | ICD-10-CM

## 2023-12-03 DIAGNOSIS — D225 Melanocytic nevi of trunk: Secondary | ICD-10-CM | POA: Diagnosis not present

## 2023-12-03 MED ORDER — CLINDAMYCIN PHOS-BENZOYL PEROX 1.2-5 % EX GEL
CUTANEOUS | 11 refills | Status: AC
  Filled 2023-12-03: qty 45, 30d supply, fill #0

## 2023-12-03 MED ORDER — SPIRONOLACTONE 100 MG PO TABS
100.0000 mg | ORAL_TABLET | Freq: Every day | ORAL | 3 refills | Status: AC
  Filled 2023-12-03: qty 90, 90d supply, fill #0

## 2023-12-03 NOTE — Progress Notes (Signed)
 Follow-Up Visit   Subjective  Deborah Mills is a 48 y.o. female who presents for the following: Skin Cancer Screening and Full Body Skin Exam. History of DN.  The patient presents for Total-Body Skin Exam (TBSE) for skin cancer screening and mole check. The patient has spots, moles and lesions to be evaluated, some may be new or changing. Acne improved with Spironolactone  100 MG daily, no side effects. Patient not using topical, but would like refill. She used Duac gel in the past.    The following portions of the chart were reviewed this encounter and updated as appropriate: medications, allergies, medical history  Review of Systems:  No other skin or systemic complaints except as noted in HPI or Assessment and Plan.  Objective  Well appearing patient in no apparent distress; mood and affect are within normal limits.  A full examination was performed including scalp, head, eyes, ears, nose, lips, neck, chest, axillae, abdomen, back, buttocks, bilateral upper extremities, bilateral lower extremities, hands, feet, fingers, toes, fingernails, and toenails. All findings within normal limits unless otherwise noted below.   Relevant physical exam findings are noted in the Assessment and Plan.    Assessment & Plan   SKIN CANCER SCREENING PERFORMED TODAY.  ACTINIC DAMAGE - Chronic condition, secondary to cumulative UV/sun exposure - diffuse scaly erythematous macules with underlying dyspigmentation - Recommend daily broad spectrum sunscreen SPF 30+ to sun-exposed areas, reapply every 2 hours as needed.  - Staying in the shade or wearing long sleeves, sun glasses (UVA+UVB protection) and wide brim hats (4-inch brim around the entire circumference of the hat) are also recommended for sun protection.  - Call for new or changing lesions.  LENTIGINES, SEBORRHEIC KERATOSES, HEMANGIOMAS - Benign normal skin lesions - waxy tan macules on the chest (SKs) - Benign-appearing - Call for any  changes  MELANOCYTIC NEVI - Tan-brown and/or pink-flesh-colored symmetric macules and papules - left upper breast 4 mm medium dark brown macule with notch - left upper flank 6 x 4 mm speckled medium brown papule - right super pubic 6 x 3 mm two-tone brown macule  - Left Upper Back 4 mm firm flesh papule  - Benign appearing on exam today - Observation - Call clinic for new or changing moles - Recommend daily use of broad spectrum spf 30+ sunscreen to sun-exposed areas.   ACNE VULGARIS Exam: resolving cystic papule at the left chin; inflammatory papule at the right temple  Chronic and persistent condition with duration or expected duration over one year. Condition is improving with treatment but not currently at goal.  Treatment Plan: BP 120/85 Continue Spironolactone  100mg  take 1 po QD dsp #90    Start generic Duac Gel QD prn flares dsp 45g 5Rf   Spironolactone  can cause increased urination and cause blood pressure to decrease. Please watch for signs of lightheadedness and be cautious when changing position. It can sometimes cause breast tenderness or an irregular period in premenopausal women. It can also increase potassium. The increase in potassium usually is not a concern unless you are taking other medicines that also increase potassium, so please be sure your doctor knows all of the other medications you are taking. This medication should not be taken by pregnant women.  This medicine should also not be taken together with sulfa  drugs like Bactrim  (trimethoprim /sulfamethexazole).     Benzoyl peroxide  can cause dryness and irritation of the skin. It can also bleach fabric. When used together with Aczone (dapsone) cream, it can stain  the skin orange.  Long term medication management.  Patient is using long term (months to years) prescription medication  to control their dermatologic condition.  These medications require periodic monitoring to evaluate for efficacy and side effects and  may require periodic laboratory monitoring.   TELANGIECTASIA Exam: 2.0 mm blanching pink macule, central nasal tip, Benign features under dermoscopy.   Treatment Plan: Benign appearing on exam, stable Call for changes  HISTORY OF DYSPLASTIC NEVUS  Left lower leg, 2006 No evidence of recurrence today Recommend regular full body skin exams Recommend daily broad spectrum sunscreen SPF 30+ to sun-exposed areas, reapply every 2 hours as needed.  Call if any new or changing lesions are noted between office visits  ACNE VULGARIS   Related Medications Clindamycin-Benzoyl Per, Refr, gel Apply to face once a day for acne. spironolactone  (ALDACTONE ) 100 MG tablet Take 1 tablet (100 mg total) by mouth daily. Return in about 1 year (around 12/02/2024) for TBSE, Hx Dysplastic Nevus.  IAndrea Kerns, CMA, am acting as scribe for Rexene Rattler, MD .   Documentation: I have reviewed the above documentation for accuracy and completeness, and I agree with the above.  Rexene Rattler, MD

## 2023-12-03 NOTE — Patient Instructions (Addendum)

## 2023-12-16 DIAGNOSIS — Z13228 Encounter for screening for other metabolic disorders: Secondary | ICD-10-CM | POA: Diagnosis not present

## 2023-12-16 DIAGNOSIS — Z13220 Encounter for screening for lipoid disorders: Secondary | ICD-10-CM | POA: Diagnosis not present

## 2023-12-16 DIAGNOSIS — Z1329 Encounter for screening for other suspected endocrine disorder: Secondary | ICD-10-CM | POA: Diagnosis not present

## 2024-01-05 ENCOUNTER — Encounter: Payer: Self-pay | Admitting: Family Medicine

## 2024-01-05 ENCOUNTER — Ambulatory Visit: Admitting: Family Medicine

## 2024-01-05 ENCOUNTER — Other Ambulatory Visit: Payer: Self-pay

## 2024-01-05 VITALS — BP 128/86 | HR 92 | Ht 65.0 in

## 2024-01-05 DIAGNOSIS — M7741 Metatarsalgia, right foot: Secondary | ICD-10-CM | POA: Diagnosis not present

## 2024-01-05 DIAGNOSIS — M25552 Pain in left hip: Secondary | ICD-10-CM | POA: Diagnosis not present

## 2024-01-05 MED ORDER — CYCLOBENZAPRINE HCL 5 MG PO TABS
5.0000 mg | ORAL_TABLET | Freq: Three times a day (TID) | ORAL | 0 refills | Status: AC | PRN
  Filled 2024-01-05: qty 60, 10d supply, fill #0

## 2024-01-05 MED ORDER — DICLOFENAC SODIUM 75 MG PO TBEC
DELAYED_RELEASE_TABLET | ORAL | 0 refills | Status: AC
  Filled 2024-01-05: qty 60, 30d supply, fill #0

## 2024-01-05 NOTE — Progress Notes (Signed)
 Primary Care / Sports Medicine Office Visit  Patient Information:  Patient ID: Deborah Mills, female DOB: 05-24-1975 Age: 48 y.o. MRN: [id number]   Deborah Mills is a pleasant 48 y.o. female presenting with the following:  Chief Complaint  Patient presents with   Hip Pain    Left hip pain. Pain is chronic but has got worse over the last couple months. Patient has a constant ache. Aggravating factors lying down on left side and sitting. She has tried Mobic  which helped when she was on it. Xray's obtained.     Vitals:   01/05/24 0912  BP: 128/86  Pulse: 92  SpO2: 99%   Vitals:   01/05/24 0912  Height: 5' 5 (1.651 m)   Body mass index is 25.54 kg/m.  No results found.   Discussed the use of AI scribe software for clinical note transcription with the patient, who gave verbal consent to proceed.   Independent interpretation of notes and tests performed by another provider:   None  Procedures performed:   None  Pertinent History, Exam, Impression, and Recommendations:   History of Present Illness Deborah Mills is a 48 year old female who presents with left hip pain.  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 - Pain characterized as a dull ache, most prominent when lying on left side and exacerbated by prolonged sitting. - No associated groin pain. - Pain does not radiate. - Standing for prolonged periods increases pain severity. - Previous use of Mobic  (meloxicam ) for two months provided relief when taken consistently. - Attended stretch lab for one month with some improvement, discontinued due to cost.  Right forefoot pain - Recent onset of pain localized to the dorsum of the right forefoot at the second and third metatarsophalangeal (MTP) regions. - Pain began after wearing a different pair of shoes. - Uses full-length  orthotic inserts.  Physical Exam LEFT HIP INSPECTION: No deformity, erythema, or swelling. Normal pelvic alignment. PALPATION: Focal tenderness over the posterior aspect of the left greater trochanter. Non-tender at the left sacroiliac joint and throughout the gluteus medius distribution. Mild tenderness at the piriformis without reproduction of primary symptoms. RANGE OF MOTION: Full hip flexion, extension, abduction, and adduction without mechanical limitation. STRENGTH: 5/5 strength in hip flexion, extension, abduction, and adduction without pain. NEUROLOGICAL: Sensation intact to light touch in the left lower extremity; no focal motor deficit. SPECIAL TESTS: Negative FADIR test. Equivocal FABER test on the left. Negative straight leg raise. Negative piriformis test for symptom reproduction.   RIGHT FOOT INSPECTION: Hallux valgus deformity noted at the right first metatarsophalangeal (MTP) joint. No erythema or swelling elsewhere. PALPATION: Mild tenderness at the right first MTP joint. Primary tenderness over the dorsum of the forefoot localized to the second and third MTP regions. Non-tender at the plantar fascia origin and calcaneus. RANGE OF MOTION: Full range of motion at the ankle and MTP joints without restriction. STRENGTH: 5/5 strength in ankle dorsiflexion, plantarflexion, inversion, and eversion. Toe flexion and extension intact. NEUROLOGICAL: Sensation intact to light touch throughout the right foot; no focal motor deficits. SPECIAL TESTS: No instability at the MTP joints.   Results RADIOLOGY Hip X-ray: No degenerative changes bilaterally at femoroacetabular joints, subtle well-circumscribed osteophyte at the right.  Assessment and Plan Greater trochanteric pain syndrome, left hip Chronic left hip pain due to muscle inflammation,  likely from tight hip stabilizer muscles. X-rays negative for arthritis. Prefers non-pharmacological management. - Prescribed diclofenac  twice daily  for one week, then as needed. - Prescribed cyclobenzaprine  as needed for muscle relaxation. - Provided exercises for stretching and strengthening hip muscles. - Advised against NSAIDs other than diclofenac  while on treatment. - Discussed potential for cortisone injection if symptoms persist after one month.  Metatarsalgia, right foot Recent right foot pain at second and third MTP joints, likely from new footwear and pre-existing bunion. Mild pain, no significant plantar fascia tenderness. - Recommended trial of full-length orthotic inserts. - Advised use of metatarsal pads to offload pressure. - Prescribed diclofenac  for pain management.  Hallux valgus, right foot Chronic bunion at right first MTP joint with mild tenderness. - Continue to monitor for any changes in symptoms or functional limitations.  Problem List Items Addressed This Visit     Greater trochanteric pain syndrome of left lower extremity - Primary   Relevant Medications   diclofenac  (VOLTAREN ) 75 MG EC tablet   cyclobenzaprine  (FLEXERIL ) 5 MG tablet   Metatarsalgia of right foot   Relevant Medications   diclofenac  (VOLTAREN ) 75 MG EC tablet     Orders & Medications Medications:  Meds ordered this encounter  Medications   diclofenac  (VOLTAREN ) 75 MG EC tablet    Sig: Take 1 tablet (75 mg total) by mouth 2 (two) times daily. X 1 week then BID PRN    Dispense:  60 tablet    Refill:  0   cyclobenzaprine  (FLEXERIL ) 5 MG tablet    Sig: Take 1-2 tablets (5-10 mg total) by mouth 3 (three) times daily as needed.    Dispense:  60 tablet    Refill:  0   No orders of the defined types were placed in this encounter.    No follow-ups on file.     Selinda Deborah Ku, MD, [facility]   Primary Care Sports Medicine Primary Care and Sports Medicine at [facility] [place]

## 2024-01-05 NOTE — Patient Instructions (Signed)
 VISIT SUMMARY:  Today, we addressed your chronic left hip pain and recent right forefoot pain. We discussed treatment options and provided prescriptions and recommendations to help manage your symptoms.  YOUR PLAN:  GREATER TROCHANTERIC PAIN SYNDROME, LEFT HIP: Chronic left hip pain, likely from tight hip stabilizer muscles. X-rays were negative for arthritis. -Take diclofenac  twice daily for one week, then as needed. Take with food. -Use cyclobenzaprine  as needed for muscle relaxation. -Perform the provided exercises for stretching and strengthening your hip muscles. -Avoid using NSAIDs other than diclofenac  while on this treatment. -Consider a cortisone injection if symptoms persist after one month.  METATARSALGIA, RIGHT FOOT: Recent right foot pain at the second and third MTP joints, likely from new footwear and a pre-existing bunion. -Use full-length orthotic inserts. -Use metatarsal pads to offload pressure. -Take diclofenac  for pain management.  HALLUX VALGUS, RIGHT FOOT: Chronic bunion at the right first MTP joint with mild tenderness. -Continue to monitor for any changes in symptoms or functional limitations.

## 2024-12-13 ENCOUNTER — Ambulatory Visit: Admitting: Dermatology
